# Patient Record
Sex: Female | Born: 1980 | Race: Black or African American | Hispanic: No | Marital: Married | Smoking: Never smoker
Health system: Southern US, Community
[De-identification: ages and names within clinical notes are randomized; demographics above are authoritative.]

## PROBLEM LIST (undated history)

## (undated) ENCOUNTER — Inpatient Hospital Stay (HOSPITAL_COMMUNITY): Payer: Self-pay

## (undated) DIAGNOSIS — F329 Major depressive disorder, single episode, unspecified: Secondary | ICD-10-CM

## (undated) DIAGNOSIS — E785 Hyperlipidemia, unspecified: Secondary | ICD-10-CM

## (undated) DIAGNOSIS — R011 Cardiac murmur, unspecified: Secondary | ICD-10-CM

## (undated) DIAGNOSIS — E119 Type 2 diabetes mellitus without complications: Secondary | ICD-10-CM

## (undated) DIAGNOSIS — F32A Depression, unspecified: Secondary | ICD-10-CM

## (undated) DIAGNOSIS — F419 Anxiety disorder, unspecified: Secondary | ICD-10-CM

## (undated) DIAGNOSIS — D649 Anemia, unspecified: Secondary | ICD-10-CM

## (undated) DIAGNOSIS — Z8639 Personal history of other endocrine, nutritional and metabolic disease: Secondary | ICD-10-CM

## (undated) DIAGNOSIS — I10 Essential (primary) hypertension: Secondary | ICD-10-CM

## (undated) HISTORY — PX: GASTRIC BYPASS: SHX52

## (undated) HISTORY — DX: Essential (primary) hypertension: I10

## (undated) HISTORY — PX: HERNIA REPAIR: SHX51

## (undated) HISTORY — DX: Depression, unspecified: F32.A

## (undated) HISTORY — PX: OTHER SURGICAL HISTORY: SHX169

## (undated) HISTORY — DX: Major depressive disorder, single episode, unspecified: F32.9

## (undated) HISTORY — DX: Type 2 diabetes mellitus without complications: E11.9

## (undated) HISTORY — PX: APPENDECTOMY: SHX54

## (undated) HISTORY — DX: Personal history of other endocrine, nutritional and metabolic disease: Z86.39

## (undated) HISTORY — DX: Cardiac murmur, unspecified: R01.1

## (undated) HISTORY — PX: TUBAL LIGATION: SHX77

## (undated) HISTORY — DX: Hyperlipidemia, unspecified: E78.5

## (undated) HISTORY — DX: Anxiety disorder, unspecified: F41.9

---

## 2012-10-08 ENCOUNTER — Encounter (HOSPITAL_COMMUNITY): Payer: Self-pay | Admitting: Emergency Medicine

## 2012-10-08 ENCOUNTER — Inpatient Hospital Stay (HOSPITAL_COMMUNITY)
Admission: AD | Admit: 2012-10-08 | Discharge: 2012-10-08 | Disposition: A | Discharge status: Home / Self Care | Payer: Self-pay | Attending: Obstetrics & Gynecology | Admitting: Obstetrics & Gynecology

## 2012-10-08 ENCOUNTER — Emergency Department (INDEPENDENT_AMBULATORY_CARE_PROVIDER_SITE_OTHER)
Admission: EM | Admit: 2012-10-08 | Discharge: 2012-10-08 | Disposition: A | Discharge status: Home / Self Care | Payer: Self-pay | Source: Home / Self Care

## 2012-10-08 DIAGNOSIS — K089 Disorder of teeth and supporting structures, unspecified: Secondary | ICD-10-CM

## 2012-10-08 DIAGNOSIS — K0889 Other specified disorders of teeth and supporting structures: Secondary | ICD-10-CM

## 2012-10-08 MED ORDER — PENICILLIN V POTASSIUM 500 MG PO TABS: 500.0000 mg | ORAL_TABLET | Freq: Four times a day (QID) | ORAL | Status: DC

## 2012-10-08 MED ORDER — HYDROCODONE-ACETAMINOPHEN 5-325 MG PO TABS: 1.0000 | ORAL_TABLET | Freq: Four times a day (QID) | ORAL | Status: DC | PRN

## 2012-10-08 NOTE — ED Provider Notes (Signed)
Jaclyn Vaughn is a 32 y.o. female who presents to Urgent Care today for tooth pain present for several days. Patient has pain in her upper right molar. This is been present for 2 days. It is worsened recently. She denies any fevers or chills. She's tried over-the-counter pain medications which are only marginally effective. She's been told in past that she has a cavity which is attention however she did have money to pay for dental work at that time. She feels well otherwise   PMH reviewed. Hypertension and diet-controlled diabetes History  Substance Use Topics  . Smoking status: Never Smoker   . Smokeless tobacco: Not on file  . Alcohol Use: No   ROS as above Medications reviewed. No current facility-administered medications for this encounter.   Current Outpatient Prescriptions  Medication Sig Dispense Refill  . buPROPion (WELLBUTRIN SR) 150 MG 12 hr tablet Take 150 mg by mouth 2 (two) times daily.      . citalopram (CELEXA) 40 MG tablet Take 40 mg by mouth daily.      Marland Kitchen lisinopril (PRINIVIL,ZESTRIL) 10 MG tablet Take 10 mg by mouth daily.      . Norgestimate-Ethinyl Estradiol Triphasic (ORTHO TRI-CYCLEN LO) 0.18/0.215/0.25 MG-25 MCG tab Take 1 tablet by mouth daily.      Marland Kitchen HYDROcodone-acetaminophen (NORCO) 5-325 MG per tablet Take 1 tablet by mouth every 6 (six) hours as needed for pain.  30 tablet  0  . penicillin v potassium (VEETID) 500 MG tablet Take 1 tablet (500 mg total) by mouth 4 (four) times daily.  40 tablet  0    Exam:  BP 118/61  Pulse 91  Temp(Src) 98.3 F (36.8 C) (Oral)  Resp 16  SpO2 100%  LMP 09/04/2012 Gen: Well NAD HEENT: EOMI,  MMM, no dental abscess. Upper right premolar tender to touch. Dental carries presents Lungs: CTABL Nl WOB Heart: RRR no MRG   No results found for this or any previous visit (from the past 24 hour(s)). No results found.  Assessment and Plan: 32 y.o. female with dental pain.  Penicillin 500 mg 4 times a day Norco for  pain Followup with dentist ASAP. 2 names provided.  Discussed warning signs or symptoms. Please see discharge instructions. Patient expresses understanding.      Rodolph Bong, MD 10/08/12 [phone]

## 2012-10-08 NOTE — ED Notes (Signed)
Reports: upper right back tooth pain.   Pt was told a year ago there was some decay but pt has not been able to afford dental care  Pt has been taking tylenol and advil with no relief.   Symptoms present x 2 days

## 2012-10-12 NOTE — ED Provider Notes (Signed)
Medical screening examination/treatment/procedure(s) were performed by resident physician or non-physician practitioner and as supervising physician I was immediately available for consultation/collaboration.   Royce Stegman DOUGLAS MD.   Georgena Weisheit D Anadalay Macdonell, MD 10/12/12 2048 

## 2013-01-28 ENCOUNTER — Emergency Department (HOSPITAL_COMMUNITY)
Admission: EM | Admit: 2013-01-28 | Discharge: 2013-01-28 | Disposition: A | Discharge status: Home / Self Care | Payer: Medicaid Other | Attending: Emergency Medicine | Admitting: Emergency Medicine

## 2013-01-28 ENCOUNTER — Encounter (HOSPITAL_COMMUNITY): Payer: Self-pay | Admitting: *Deleted

## 2013-01-28 DIAGNOSIS — K0889 Other specified disorders of teeth and supporting structures: Secondary | ICD-10-CM

## 2013-01-28 DIAGNOSIS — Z79899 Other long term (current) drug therapy: Secondary | ICD-10-CM | POA: Insufficient documentation

## 2013-01-28 DIAGNOSIS — K089 Disorder of teeth and supporting structures, unspecified: Secondary | ICD-10-CM | POA: Insufficient documentation

## 2013-01-28 DIAGNOSIS — K08409 Partial loss of teeth, unspecified cause, unspecified class: Secondary | ICD-10-CM

## 2013-01-28 DIAGNOSIS — K08109 Complete loss of teeth, unspecified cause, unspecified class: Secondary | ICD-10-CM | POA: Insufficient documentation

## 2013-01-28 MED ORDER — HYDROCODONE-ACETAMINOPHEN 5-325 MG PO TABS: 1.0000 | ORAL_TABLET | ORAL | Status: DC | PRN

## 2013-01-28 NOTE — ED Notes (Signed)
Already being examined by Mayer Camel for dental pain.

## 2013-01-28 NOTE — ED Notes (Signed)
Tuesday had wisdom tooth on upper top left side extracted. Pt now c/o some bleeding and pain and was told to go to an urgent care or emergency room for pain control until Monday.

## 2013-01-28 NOTE — ED Provider Notes (Signed)
History    CSN: [id number] Arrival date & time 01/28/13  1955  First MD Initiated Contact with Patient 01/28/13 2023     Chief Complaint  Patient presents with  . Dental Pain   (Consider location/radiation/quality/duration/timing/severity/associated sxs/prior Treatment) Patient is a 32 y.o. female presenting with tooth pain. The history is provided by the patient.  Dental Pain Location:  Upper Upper teeth location:  16/LU 3rd molar Quality:  Shooting, throbbing and constant Severity:  Severe Associated symptoms: no fever, no headaches and no neck pain  Facial swelling: minimal.    KELLYJO EDGREN is a 32 y.o. female who presents to the ED with pain in the area where the third upper left molar was extracted 4 days ago. She was started on Amoxicillin 500 mg and given Percocet for pain at the time of the extraction. She only had 10 percocet. Today she called the office to tell them she still had pain and they were closed and the answering service told the patient to come to the ED for pain management until the office opened on Monday.   History reviewed. No pertinent past medical history. Past Surgical History  Procedure Laterality Date  . Gastric bypass     History reviewed. No pertinent family history. History  Substance Use Topics  . Smoking status: Never Smoker   . Smokeless tobacco: Not on file  . Alcohol Use: No   OB History   Grav Para Term Preterm Abortions TAB SAB Ect Mult Living                 Review of Systems  Constitutional: Negative for fever, chills and activity change.  HENT: Positive for dental problem. Negative for neck pain. Facial swelling: minimal.   Respiratory: Negative for shortness of breath.   Gastrointestinal: Negative for nausea and vomiting.  Neurological: Negative for syncope and headaches.  Psychiatric/Behavioral: The patient is not nervous/anxious.     Allergies  Review of patient's allergies indicates no known allergies.  Home  Medications   Current Outpatient Rx  Name  Route  Sig  Dispense  Refill  . buPROPion (WELLBUTRIN SR) 150 MG 12 hr tablet   Oral   Take 150 mg by mouth 2 (two) times daily.         . citalopram (CELEXA) 40 MG tablet   Oral   Take 40 mg by mouth daily.         Marland Kitchen HYDROcodone-acetaminophen (NORCO) 5-325 MG per tablet   Oral   Take 1 tablet by mouth every 6 (six) hours as needed for pain.   30 tablet   0   . lisinopril (PRINIVIL,ZESTRIL) 10 MG tablet   Oral   Take 10 mg by mouth daily.         . Norgestimate-Ethinyl Estradiol Triphasic (ORTHO TRI-CYCLEN LO) 0.18/0.215/0.25 MG-25 MCG tab   Oral   Take 1 tablet by mouth daily.         . penicillin v potassium (VEETID) 500 MG tablet   Oral   Take 1 tablet (500 mg total) by mouth 4 (four) times daily.   40 tablet   0    BP 153/98  Pulse 88  Temp(Src) 98.3 F (36.8 C) (Oral)  Resp 14  Ht 5\' 3"  (1.6 m)  Wt 189 lb (85.73 kg)  BMI 33.49 kg/m2  SpO2 100%  LMP 01/09/2013 Physical Exam  Nursing note and vitals reviewed. Constitutional: She is oriented to person, place, and time. She appears well-developed  and well-nourished. No distress.  HENT:  Head: Normocephalic.  Mouth/Throat: Uvula is midline, oropharynx is clear and moist and mucous membranes are normal.    Pain in area where third molar upper left extracted.  Eyes: EOM are normal.  Neck: Neck supple.  Pulmonary/Chest: Effort normal.  Abdominal: Soft. There is no tenderness.  Musculoskeletal: Normal range of motion.  Lymphadenopathy:    She has no cervical adenopathy.  Neurological: She is alert and oriented to person, place, and time. No cranial nerve deficit.  Skin: Skin is warm and dry.  Psychiatric: She has a normal mood and affect. Her behavior is normal.    ED Course  Procedures  MDM  32 y.o. female with pain due to wisdom tooth extraction upper left.  Pain management and follow up with dentist on Monday.  Discussed with the patient plan of  care and all questioned fully answered.    Medication List    STOP taking these medications       penicillin v potassium 500 MG tablet  Commonly known as:  VEETID      TAKE these medications       HYDROcodone-acetaminophen 5-325 MG per tablet  Commonly known as:  NORCO/VICODIN  Take 1 tablet by mouth every 4 (four) hours as needed.      ASK your doctor about these medications       amoxicillin 500 MG capsule  Commonly known as:  AMOXIL  Take 500 mg by mouth 3 (three) times daily.     buPROPion 150 MG 12 hr tablet  Commonly known as:  WELLBUTRIN SR  Take 150 mg by mouth 2 (two) times daily.     citalopram 40 MG tablet  Commonly known as:  CELEXA  Take 40 mg by mouth daily.     lisinopril 10 MG tablet  Commonly known as:  PRINIVIL,ZESTRIL  Take 10 mg by mouth daily.     ORTHO TRI-CYCLEN LO 0.18/0.215/0.25 MG-25 MCG tab  Generic drug:  Norgestimate-Ethinyl Estradiol Triphasic  Take 1 tablet by mouth daily.         [facility] Orlene Och, [place] 01/29/13 [phone]

## 2013-01-29 NOTE — ED Provider Notes (Signed)
Medical screening examination/treatment/procedure(s) were performed by non-physician practitioner and as supervising physician I was immediately available for consultation/collaboration.  Flint Melter, MD 01/29/13 0157

## 2014-01-27 ENCOUNTER — Encounter: Payer: Self-pay | Admitting: *Deleted

## 2014-02-09 ENCOUNTER — Encounter: Payer: Self-pay | Admitting: Advanced Practice Midwife

## 2015-02-05 ENCOUNTER — Encounter: Payer: Self-pay | Admitting: Adult Health

## 2015-02-15 ENCOUNTER — Encounter: Payer: Medicaid Other | Attending: Nurse Practitioner | Admitting: *Deleted

## 2015-02-15 ENCOUNTER — Ambulatory Visit (HOSPITAL_COMMUNITY)
Admission: RE | Admit: 2015-02-15 | Discharge: 2015-02-15 | Disposition: A | Discharge status: Home / Self Care | Payer: Medicaid Other | Source: Ambulatory Visit | Attending: Unknown Physician Specialty | Admitting: Unknown Physician Specialty

## 2015-02-15 DIAGNOSIS — O24111 Pre-existing diabetes mellitus, type 2, in pregnancy, first trimester: Secondary | ICD-10-CM | POA: Insufficient documentation

## 2015-02-15 DIAGNOSIS — Z713 Dietary counseling and surveillance: Secondary | ICD-10-CM | POA: Insufficient documentation

## 2015-02-15 NOTE — Progress Notes (Signed)
  Patient was seen on 02/15/15 for Diabetes (complicated by pregnancy ) self-management . Prior to pregnancy patient taking Janumet 50/1000. She was advised to continue and added Levimir 25units HS on 02/15/15. Inconsult with Dr. Burnett Harry we will continue this regimen at this time.  The following learning objectives were met by the patient :   States why dietary management is important in controlling blood glucose  Describes the effects of carbohydrates on blood glucose levels  Demonstrates ability to create a balanced meal plan  Demonstrates carbohydrate counting   States when to check blood glucose levels  Demonstrates proper blood glucose monitoring techniques  States the effect of stress and exercise on blood glucose levels  Plan:  Aim for 2 Carb Choices per meal (30 grams) +/- 1 either way for breakfast Aim for 3 Carb Choices per meal (45 grams) +/- 1 either way from lunch and dinner Aim for 1-2 Carbs per snack Begin reading food labels for Total Carbohydrate and sugar grams of foods Consider  increasing your activity level by walking daily as tolerated Begin checking BG before breakfast and 2 hours after first bit of breakfast, lunch and dinner after  as directed by MD  Take medication  as directed by MD  Blood glucose monitor given: None - patient presently testing BID  Patient instructed to monitor glucose levels: FBS: 60 - <90 2 hour: <120  Patient received the following handouts:  Nutrition Diabetes and Pregnancy  Carbohydrate Counting List  Meal Planning worksheet  Patient will be seen for follow-up as needed.

## 2015-02-23 ENCOUNTER — Encounter (HOSPITAL_COMMUNITY): Payer: Self-pay

## 2015-02-23 ENCOUNTER — Ambulatory Visit (HOSPITAL_COMMUNITY)
Admission: RE | Admit: 2015-02-23 | Discharge: 2015-02-23 | Disposition: A | Discharge status: Home / Self Care | Payer: Medicaid Other | Source: Ambulatory Visit | Attending: Unknown Physician Specialty | Admitting: Unknown Physician Specialty

## 2015-02-23 VITALS — BP 133/96 | HR 80 | Wt 248.6 lb

## 2015-02-23 DIAGNOSIS — E1165 Type 2 diabetes mellitus with hyperglycemia: Secondary | ICD-10-CM | POA: Insufficient documentation

## 2015-02-23 DIAGNOSIS — O99842 Bariatric surgery status complicating pregnancy, second trimester: Secondary | ICD-10-CM | POA: Diagnosis not present

## 2015-02-23 DIAGNOSIS — O3421 Maternal care for scar from previous cesarean delivery: Secondary | ICD-10-CM | POA: Diagnosis not present

## 2015-02-23 DIAGNOSIS — O10912 Unspecified pre-existing hypertension complicating pregnancy, second trimester: Secondary | ICD-10-CM | POA: Diagnosis not present

## 2015-02-23 DIAGNOSIS — O99342 Other mental disorders complicating pregnancy, second trimester: Secondary | ICD-10-CM | POA: Diagnosis not present

## 2015-02-23 DIAGNOSIS — O24912 Unspecified diabetes mellitus in pregnancy, second trimester: Secondary | ICD-10-CM

## 2015-02-23 DIAGNOSIS — O99012 Anemia complicating pregnancy, second trimester: Secondary | ICD-10-CM | POA: Diagnosis not present

## 2015-02-23 DIAGNOSIS — Z3A14 14 weeks gestation of pregnancy: Secondary | ICD-10-CM | POA: Insufficient documentation

## 2015-02-23 DIAGNOSIS — D509 Iron deficiency anemia, unspecified: Secondary | ICD-10-CM | POA: Insufficient documentation

## 2015-02-23 DIAGNOSIS — F329 Major depressive disorder, single episode, unspecified: Secondary | ICD-10-CM | POA: Diagnosis not present

## 2015-02-23 DIAGNOSIS — O24112 Pre-existing diabetes mellitus, type 2, in pregnancy, second trimester: Secondary | ICD-10-CM | POA: Insufficient documentation

## 2015-02-23 LAB — COMPREHENSIVE METABOLIC PANEL
ALT: 12 U/L — AB (ref 14–54)
AST: 18 U/L (ref 15–41)
Albumin: 3.4 g/dL — ABNORMAL LOW (ref 3.5–5.0)
Alkaline Phosphatase: 39 U/L (ref 38–126)
Anion gap: 10 (ref 5–15)
BUN: 6 mg/dL (ref 6–20)
CALCIUM: 9.3 mg/dL (ref 8.9–10.3)
CO2: 24 mmol/L (ref 22–32)
CREATININE: 0.46 mg/dL (ref 0.44–1.00)
Chloride: 100 mmol/L — ABNORMAL LOW (ref 101–111)
GLUCOSE: 72 mg/dL (ref 65–99)
Potassium: 4.6 mmol/L (ref 3.5–5.1)
SODIUM: 134 mmol/L — AB (ref 135–145)
Total Bilirubin: 0.4 mg/dL (ref 0.3–1.2)
Total Protein: 6.8 g/dL (ref 6.5–8.1)

## 2015-02-23 LAB — FERRITIN: FERRITIN: 6 ng/mL — AB (ref 11–307)

## 2015-02-23 LAB — VITAMIN B12: VITAMIN B 12: 71 pg/mL — AB (ref 180–914)

## 2015-02-23 NOTE — Consult Note (Signed)
Maternal Fetal Medicine Consultation  Requesting Provider(s): Jaclyn Dick, MD  Reason for consultation: Type 2 diabetes, hx of gastric bypass  HPI: Jaclyn Vaughn is a 34 yo G3P1102, EDD 08/19/2015 who is currently at 14w 5d was seen for consultation due to poorly controlled pre-existing type 2 diabetes and a history of gastric bypass. She was diagnosed with diabetes in 2001, and to her knowledge, does not have any known end-organ disease.  Her last evaluation of her eye grounds was in Sept 2015. She underwent gastric bypass sin 2009 and subsequently lost 153 lbs (later gained 60 lbs back).  She previously had chronic hypertension, but has not required medication to control her blood pressures.  Her HbA1C was 10.7% in May 2016, but her most recent HbA1C was 6.8 on July 28th.  She is currently on Metformin/ Jenuvia (Janumet 50/1000) and Levimir 25 units HS.  Review of her fingerstick values shows some rare elevated fasting values and some rare post prandial values > 130- 85-90% of her CPG values are within target range.  Jaclyn Vaughn reports a history of preeclampsia with her first pregnancy and delivered via C-section at 35 weeks.  Her second pregnancy was an elective repeat C-section at 39 weeks without complications.  She is without complaints today.  OB History: OB History    Gravida Para Term Preterm AB TAB SAB Ectopic Multiple Living   0 0 0 0 0 2      PMH:  Past Medical History  Diagnosis Date  . History of vitamin D deficiency   . Diabetes mellitus without complication   . Anxiety   . Depression   . Heart murmur   . Myocardial infarction   . Hypertension   . Hyperlipidemia     PSH:  Past Surgical History  Procedure Laterality Date  . Gastric bypass    . Cesarean section  02/2005  . Lap wound exploration     Meds:  Celexa 20 mg daily Ferrous sulfate 325 mg daily Vitamin D 5000 units 2x weekly Folic acid 1 mg daily Levimir 25 units HS Janumet 50/1000  daily Prenatal vitamins  Allergies: No Known Allergies   FH:  Family History  Problem Relation Age of Onset  . Diabetes Mother   . Hypertension Mother   . Healthy Daughter   . Healthy Son    Soc:  Social History   Social History  . Marital Status: Single    Spouse Name: N/A  . Number of Children: N/A  . Years of Education: N/A   Occupational History  . Not on file.   Social History Main Topics  . Smoking status: Never Smoker   . Smokeless tobacco: Not on file  . Alcohol Use: No  . Drug Use: No  . Sexual Activity: Yes    Birth Control/ Protection: Condom   Other Topics Concern  . Not on file   Social History Narrative    Review of Systems: no vaginal bleeding or cramping/contractions, no LOF, no nausea/vomiting. All other systems reviewed and are negative.  PE:  VS: BP   133/96                 pulse   80                Weight  248.6 lbs   A/P: 1) Single IUP at 14w 5d  2) Type 2 diabetes - initial HbA1C > 10; improved, now at 6.8%.  Overall reasonable control  on current medications.  3) History of Gastric bypass  4) Iron deficiency anemia  5) Depression on Celexa  6) Chronic hypertension not on medications  7) Hx of Cesarean delivery x 2  Recommendations: 1) While Januvia (Sitagliptin) is not thought to be a teratogen, there is very limited experience with it's use in pregnancy.  Because of this, I would recommend that this be discontinued.  We will continue Metformin 500 mg BID and Levimir 25 units HS.  As the pregnancy progresses, she will likely need a higher dose and will very likely need meal time Humolog as well.  Jaclyn Vaughn will call her fingerstick log results to [place] in 2 weeks for review and will also follow up with her primary Obstetricians. 2) Baseline CMP, B12 and Ferritin levels were drawn.  Would recommend that a baseline 24-hr urine protein / creatinine clearance be ordered at the next prenatal visit. 3) Detailed ultrasound at 18 weeks (ordered).   The patient will need serial growth studies every 4 weeks thereafter. 5) Fetal echo at 20-22 weeks (ordered) 7) The patient has a baseline microcytic anemia with a Hb of 10.4. Due to the patient's prior R-Y gastric bypass, she may have some difficulty absorbing iron and may be a candidate for IV iron supplementation.  If she becomes increasingly anemic despite iron supplements, this may need to be explored further. 8) Antenatal testing beginning no later than [redacted] weeks gestation 19) Recommend delivery by [redacted] weeks gestation in the absence of other complications.   Thank you for the opportunity to be a part of the care of Jaclyn Vaughn. Please contact our office if we can be of further assistance.   I spent approximately 30 minutes with this patient with over 50% of time spent in face-to-face counseling.  Alpha Gula, MD Maternal Fetal Medicine

## 2015-02-23 NOTE — ED Notes (Signed)
Prescription for Metformin  BID called into [facility].

## 2015-02-26 ENCOUNTER — Other Ambulatory Visit (HOSPITAL_COMMUNITY): Payer: Self-pay | Admitting: Unknown Physician Specialty

## 2015-02-26 ENCOUNTER — Telehealth (HOSPITAL_COMMUNITY): Payer: Self-pay | Admitting: *Deleted

## 2015-02-26 NOTE — Telephone Encounter (Signed)
Called Jaclyn Vaughn with her lab results from last week.  Notified of her ferretin level being low and B-12 being low.  These results have been faxed to Dr. Madalyn Rob office and I called and spoke with his nurse Misty Stanley about these lab results.  Instructed patient to call office if she has not heard from them after a couple of days.  Pt verbalized understanding.

## 2015-03-27 ENCOUNTER — Other Ambulatory Visit (HOSPITAL_COMMUNITY): Payer: Self-pay | Admitting: *Deleted

## 2015-03-27 ENCOUNTER — Other Ambulatory Visit (HOSPITAL_COMMUNITY): Payer: Self-pay | Admitting: Unknown Physician Specialty

## 2015-03-27 ENCOUNTER — Other Ambulatory Visit (HOSPITAL_COMMUNITY): Payer: Self-pay | Admitting: Obstetrics and Gynecology

## 2015-03-27 ENCOUNTER — Ambulatory Visit (HOSPITAL_COMMUNITY)
Admission: RE | Admit: 2015-03-27 | Discharge: 2015-03-27 | Disposition: A | Discharge status: Home / Self Care | Payer: Medicaid Other | Source: Ambulatory Visit | Attending: Unknown Physician Specialty | Admitting: Unknown Physician Specialty

## 2015-03-27 ENCOUNTER — Encounter (HOSPITAL_COMMUNITY): Payer: Self-pay

## 2015-03-27 ENCOUNTER — Other Ambulatory Visit (HOSPITAL_COMMUNITY): Payer: Self-pay | Admitting: Maternal and Fetal Medicine

## 2015-03-27 DIAGNOSIS — Z3A19 19 weeks gestation of pregnancy: Secondary | ICD-10-CM | POA: Insufficient documentation

## 2015-03-27 DIAGNOSIS — O24912 Unspecified diabetes mellitus in pregnancy, second trimester: Secondary | ICD-10-CM | POA: Insufficient documentation

## 2015-03-27 DIAGNOSIS — O09212 Supervision of pregnancy with history of pre-term labor, second trimester: Secondary | ICD-10-CM

## 2015-03-27 DIAGNOSIS — O10019 Pre-existing essential hypertension complicating pregnancy, unspecified trimester: Secondary | ICD-10-CM

## 2015-03-27 DIAGNOSIS — O09892 Supervision of other high risk pregnancies, second trimester: Secondary | ICD-10-CM

## 2015-03-27 DIAGNOSIS — O09522 Supervision of elderly multigravida, second trimester: Secondary | ICD-10-CM | POA: Insufficient documentation

## 2015-03-27 DIAGNOSIS — O24112 Pre-existing diabetes mellitus, type 2, in pregnancy, second trimester: Secondary | ICD-10-CM

## 2015-03-27 DIAGNOSIS — I10 Essential (primary) hypertension: Secondary | ICD-10-CM | POA: Insufficient documentation

## 2015-03-27 DIAGNOSIS — Z3689 Encounter for other specified antenatal screening: Secondary | ICD-10-CM

## 2015-03-27 DIAGNOSIS — E119 Type 2 diabetes mellitus without complications: Secondary | ICD-10-CM

## 2015-03-27 DIAGNOSIS — O24919 Unspecified diabetes mellitus in pregnancy, unspecified trimester: Principal | ICD-10-CM

## 2015-03-27 MED ORDER — METFORMIN HCL 500 MG PO TABS: 500.0000 mg | ORAL_TABLET | Freq: Two times a day (BID) | ORAL | Status: AC

## 2015-04-24 ENCOUNTER — Ambulatory Visit (HOSPITAL_COMMUNITY)
Admission: RE | Admit: 2015-04-24 | Discharge: 2015-04-24 | Disposition: A | Discharge status: Home / Self Care | Payer: Medicaid Other | Source: Ambulatory Visit | Attending: Unknown Physician Specialty | Admitting: Unknown Physician Specialty

## 2015-04-24 VITALS — BP 129/80 | HR 78 | Wt 264.4 lb

## 2015-04-24 DIAGNOSIS — O24919 Unspecified diabetes mellitus in pregnancy, unspecified trimester: Secondary | ICD-10-CM | POA: Diagnosis not present

## 2015-04-24 DIAGNOSIS — E119 Type 2 diabetes mellitus without complications: Secondary | ICD-10-CM | POA: Diagnosis not present

## 2015-04-24 DIAGNOSIS — O09522 Supervision of elderly multigravida, second trimester: Secondary | ICD-10-CM

## 2015-05-22 ENCOUNTER — Encounter (HOSPITAL_COMMUNITY): Payer: Self-pay

## 2015-05-22 ENCOUNTER — Ambulatory Visit (HOSPITAL_COMMUNITY)
Admission: RE | Admit: 2015-05-22 | Discharge: 2015-05-22 | Disposition: A | Discharge status: Home / Self Care | Payer: Medicaid Other | Source: Ambulatory Visit | Attending: Unknown Physician Specialty | Admitting: Unknown Physician Specialty

## 2015-05-22 VITALS — BP 128/64 | HR 82 | Wt 265.2 lb

## 2015-05-22 DIAGNOSIS — O10019 Pre-existing essential hypertension complicating pregnancy, unspecified trimester: Secondary | ICD-10-CM | POA: Insufficient documentation

## 2015-05-22 DIAGNOSIS — O09522 Supervision of elderly multigravida, second trimester: Secondary | ICD-10-CM | POA: Insufficient documentation

## 2015-05-22 DIAGNOSIS — O99212 Obesity complicating pregnancy, second trimester: Secondary | ICD-10-CM | POA: Diagnosis not present

## 2015-05-22 DIAGNOSIS — O09523 Supervision of elderly multigravida, third trimester: Secondary | ICD-10-CM

## 2015-05-22 DIAGNOSIS — Z36 Encounter for antenatal screening of mother: Secondary | ICD-10-CM | POA: Insufficient documentation

## 2015-05-22 DIAGNOSIS — O24112 Pre-existing diabetes mellitus, type 2, in pregnancy, second trimester: Secondary | ICD-10-CM | POA: Insufficient documentation

## 2015-05-22 DIAGNOSIS — E119 Type 2 diabetes mellitus without complications: Secondary | ICD-10-CM

## 2015-05-22 DIAGNOSIS — Z3A27 27 weeks gestation of pregnancy: Secondary | ICD-10-CM | POA: Insufficient documentation

## 2015-05-22 DIAGNOSIS — O24919 Unspecified diabetes mellitus in pregnancy, unspecified trimester: Secondary | ICD-10-CM

## 2015-06-19 ENCOUNTER — Ambulatory Visit (HOSPITAL_COMMUNITY)
Admission: RE | Admit: 2015-06-19 | Discharge: 2015-06-19 | Disposition: A | Discharge status: Home / Self Care | Payer: Medicaid Other | Source: Ambulatory Visit | Attending: Nurse Practitioner | Admitting: Nurse Practitioner

## 2015-06-19 ENCOUNTER — Other Ambulatory Visit (HOSPITAL_COMMUNITY): Payer: Self-pay | Admitting: Maternal and Fetal Medicine

## 2015-06-19 DIAGNOSIS — O34219 Maternal care for unspecified type scar from previous cesarean delivery: Secondary | ICD-10-CM | POA: Diagnosis not present

## 2015-06-19 DIAGNOSIS — O24113 Pre-existing diabetes mellitus, type 2, in pregnancy, third trimester: Secondary | ICD-10-CM

## 2015-06-19 DIAGNOSIS — O09293 Supervision of pregnancy with other poor reproductive or obstetric history, third trimester: Secondary | ICD-10-CM | POA: Diagnosis not present

## 2015-06-19 DIAGNOSIS — O99212 Obesity complicating pregnancy, second trimester: Secondary | ICD-10-CM

## 2015-06-19 DIAGNOSIS — O99213 Obesity complicating pregnancy, third trimester: Secondary | ICD-10-CM | POA: Diagnosis not present

## 2015-06-19 DIAGNOSIS — O99843 Bariatric surgery status complicating pregnancy, third trimester: Secondary | ICD-10-CM | POA: Diagnosis not present

## 2015-06-19 DIAGNOSIS — Z3A31 31 weeks gestation of pregnancy: Secondary | ICD-10-CM

## 2015-06-19 DIAGNOSIS — O10019 Pre-existing essential hypertension complicating pregnancy, unspecified trimester: Secondary | ICD-10-CM

## 2015-06-19 DIAGNOSIS — O09523 Supervision of elderly multigravida, third trimester: Secondary | ICD-10-CM | POA: Diagnosis not present

## 2015-06-19 DIAGNOSIS — O09299 Supervision of pregnancy with other poor reproductive or obstetric history, unspecified trimester: Secondary | ICD-10-CM

## 2015-06-19 DIAGNOSIS — O1013 Pre-existing hypertensive heart disease complicating the puerperium: Secondary | ICD-10-CM | POA: Diagnosis not present

## 2015-06-19 DIAGNOSIS — O24919 Unspecified diabetes mellitus in pregnancy, unspecified trimester: Secondary | ICD-10-CM

## 2015-06-19 DIAGNOSIS — E119 Type 2 diabetes mellitus without complications: Secondary | ICD-10-CM

## 2015-07-17 ENCOUNTER — Encounter (HOSPITAL_COMMUNITY): Payer: Self-pay | Admitting: *Deleted

## 2015-07-17 ENCOUNTER — Ambulatory Visit (HOSPITAL_COMMUNITY)
Admission: RE | Admit: 2015-07-17 | Discharge: 2015-07-17 | Disposition: A | Discharge status: Home / Self Care | Payer: Medicaid Other | Source: Ambulatory Visit | Attending: Nurse Practitioner | Admitting: Nurse Practitioner

## 2015-07-17 ENCOUNTER — Inpatient Hospital Stay (HOSPITAL_COMMUNITY)
Admission: AD | Admit: 2015-07-17 | Discharge: 2015-07-17 | Disposition: A | Discharge status: Home / Self Care | Payer: Medicaid Other | Source: Ambulatory Visit | Attending: Obstetrics & Gynecology | Admitting: Obstetrics & Gynecology

## 2015-07-17 ENCOUNTER — Other Ambulatory Visit (HOSPITAL_COMMUNITY): Payer: Self-pay | Admitting: Maternal and Fetal Medicine

## 2015-07-17 DIAGNOSIS — O99843 Bariatric surgery status complicating pregnancy, third trimester: Secondary | ICD-10-CM

## 2015-07-17 DIAGNOSIS — O10013 Pre-existing essential hypertension complicating pregnancy, third trimester: Secondary | ICD-10-CM | POA: Insufficient documentation

## 2015-07-17 DIAGNOSIS — O09523 Supervision of elderly multigravida, third trimester: Secondary | ICD-10-CM | POA: Insufficient documentation

## 2015-07-17 DIAGNOSIS — O24113 Pre-existing diabetes mellitus, type 2, in pregnancy, third trimester: Secondary | ICD-10-CM | POA: Insufficient documentation

## 2015-07-17 DIAGNOSIS — Z3A35 35 weeks gestation of pregnancy: Secondary | ICD-10-CM | POA: Diagnosis not present

## 2015-07-17 DIAGNOSIS — O34219 Maternal care for unspecified type scar from previous cesarean delivery: Secondary | ICD-10-CM | POA: Diagnosis not present

## 2015-07-17 DIAGNOSIS — O4703 False labor before 37 completed weeks of gestation, third trimester: Secondary | ICD-10-CM | POA: Diagnosis not present

## 2015-07-17 DIAGNOSIS — O10019 Pre-existing essential hypertension complicating pregnancy, unspecified trimester: Secondary | ICD-10-CM

## 2015-07-17 DIAGNOSIS — E119 Type 2 diabetes mellitus without complications: Secondary | ICD-10-CM

## 2015-07-17 DIAGNOSIS — O99213 Obesity complicating pregnancy, third trimester: Secondary | ICD-10-CM

## 2015-07-17 DIAGNOSIS — O24919 Unspecified diabetes mellitus in pregnancy, unspecified trimester: Secondary | ICD-10-CM

## 2015-07-17 HISTORY — DX: Anemia, unspecified: D64.9

## 2015-07-17 LAB — URINALYSIS, ROUTINE W REFLEX MICROSCOPIC
Bilirubin Urine: NEGATIVE
Glucose, UA: NEGATIVE mg/dL
Ketones, ur: >80 mg/dL — AB
Nitrite: NEGATIVE
Protein, ur: NEGATIVE mg/dL
Specific Gravity, Urine: 1.025 (ref 1.005–1.030)
pH: 6 (ref 5.0–8.0)

## 2015-07-17 LAB — URINE MICROSCOPIC-ADD ON

## 2015-07-17 MED ORDER — TERBUTALINE SULFATE 1 MG/ML IJ SOLN
0.2500 mg | Freq: Once | INTRAMUSCULAR | Status: AC
  Administered 2015-07-17: 0.25 mg via SUBCUTANEOUS
  Filled 2015-07-17: qty 1

## 2015-07-17 MED ORDER — NIFEDIPINE 10 MG PO CAPS
10.0000 mg | ORAL_CAPSULE | Freq: Once | ORAL | Status: AC
Start: 2015-07-17 — End: 2015-07-17
  Administered 2015-07-17: 10 mg via ORAL
  Filled 2015-07-17: qty 1

## 2015-07-17 MED ORDER — NIFEDIPINE 10 MG PO CAPS
10.0000 mg | ORAL_CAPSULE | Freq: Once | ORAL | Status: AC
  Administered 2015-07-17: 10 mg via ORAL
  Filled 2015-07-17: qty 1

## 2015-07-17 MED ORDER — LACTATED RINGERS IV SOLN
INTRAVENOUS | Status: DC
Start: 2015-07-17 — End: 2015-07-18

## 2015-07-17 MED ORDER — LACTATED RINGERS IV BOLUS (SEPSIS)
1000.0000 mL | Freq: Once | INTRAVENOUS | Status: AC
  Administered 2015-07-17: 1000 mL via INTRAVENOUS

## 2015-07-17 NOTE — MAU Provider Note (Signed)
History     CSN: [id number]  Arrival date and time: 07/17/15 1630   First Provider Initiated Contact with Patient 07/17/15 1707      Chief Complaint  Patient presents with  . Labor Eval   HPI  Jaclyn Vaughn 35 y.o. [id number] @ 1937w2d presents from MFM for preterm labor evaluation. [place] in [place] and plans a [facility]. Her pregnancy is complicated by gastric bypass, hypertension, Previous C/S x 2. Denies vaginal bleeding, LOF. Pt states she has been contracting all day today.   Past Medical History  Diagnosis Date  . History of vitamin D deficiency   . Diabetes mellitus without complication (HCC)   . Anxiety   . Depression   . Heart murmur   . Hypertension   . Hyperlipidemia   . Anemia     Past Surgical History  Procedure Laterality Date  . Gastric bypass    . Cesarean section  02/2005  . Lap wound exploration    . Hernia repair      Family History  Problem Relation Age of Onset  . Diabetes Mother   . Hypertension Mother   . Healthy Daughter   . Healthy Son     Social History  Substance Use Topics  . Smoking status: Never Smoker   . Smokeless tobacco: None  . Alcohol Use: No    Allergies: No Known Allergies  No prescriptions prior to admission    Review of Systems  Constitutional: Negative for fever.  Gastrointestinal: Positive for abdominal pain.  All other systems reviewed and are negative.  Physical Exam   Blood pressure 152/78, pulse 108, resp. rate 18, last menstrual period 11/27/2014.  Physical Exam  Nursing note and vitals reviewed. Constitutional: She is oriented to person, place, and time. She appears well-developed and well-nourished. No distress.  HENT:  Head: Normocephalic and atraumatic.  Cardiovascular: Normal rate and regular rhythm.   Respiratory: Effort normal and breath sounds normal. No respiratory distress.  GI: Soft. Bowel sounds are normal. She exhibits no mass. There is no  tenderness. There is no rebound and no guarding.  Genitourinary: Vagina normal.  Musculoskeletal: Normal range of motion.  Neurological: She is alert and oriented to person, place, and time.  Skin: Skin is warm and dry.  Psychiatric: She has a normal mood and affect. Her behavior is normal. Judgment and thought content normal.   Dilation: Fingertip Effacement (%): Thick Exam by:: Wenzel,PA  Results for orders placed or performed during the hospital encounter of 07/17/15 (from the past 24 hour(s))  Urinalysis, Routine w reflex microscopic (not at [facility])     Status: Abnormal   Collection Time: 07/17/15  4:35 PM  Result Value Ref Range   Color, Urine YELLOW YELLOW   APPearance HAZY (A) CLEAR   Specific Gravity, Urine 1.025 1.005 - 1.030   pH 6.0 5.0 - 8.0   Glucose, UA NEGATIVE NEGATIVE mg/dL   Hgb urine dipstick LARGE (A) NEGATIVE   Bilirubin Urine NEGATIVE NEGATIVE   Ketones, ur >80 (A) NEGATIVE mg/dL   Protein, ur NEGATIVE NEGATIVE mg/dL   Nitrite NEGATIVE NEGATIVE   Leukocytes, UA SMALL (A) NEGATIVE  Urine microscopic-add on     Status: Abnormal   Collection Time: 07/17/15  4:35 PM  Result Value Ref Range   Squamous Epithelial / LPF 0-5 (A) NONE SEEN   WBC, UA 0-5 0 - 5 WBC/hpf   RBC / HPF 6-30 0 - 5  RBC/hpf   Bacteria, UA FEW (A) NONE SEEN    MAU Course  Procedures  MDM EFm- Contractions noted q 1-2 minutes.;mild. Pt has had 2 doses of procardia and a bag of LR. Pt is sleeping now. Not feeling contractions. Pt will be discharged home tonight. Care signed off to [facility].  Clemmons,Lori Grissett 07/17/2015, 5:22 PM   2000 - Care assumed from [facility], CNM. Patient began contracting q 1 minute around 1956. Third and final dose of Procardia given. Re-check of cervix is unchanged from previous. Blood noted on glove.  Discussed patient with Dr. Debroah Loop. He is present  in MAU to evaluate the patient. He recommends 0.25 mg SQ terbutaline given patient not responding  adequately to Procardia.  0.25 mg SQ terbutaline given and contractions have spaced out and are less painful per patient. Also noted to be shorter in duration and q 4-5 minutes on TOCO Discussed patient update with Dr. Debroah Loop. Recommends discharge with PTL precautions if cervix is unchanged.  Recheck of cervix is unchanged. Patient stable for discharge Assessment and Plan  A: SIUP at [redacted]w[redacted]d Preterm Contractions  P: Discharge home Preterm labor precautions discussed Patient advised to follow-up with OB provider in [place] this week Patient may return to MAU as needed or if her condition were to change or worsen  Marny Lowenstein, PA-C 07/18/2015 12:35 AM

## 2015-07-17 NOTE — Discharge Instructions (Signed)
Pelvic Rest °Pelvic rest is sometimes recommended for women when:  °· The placenta is partially or completely covering the opening of the cervix (placenta previa). °· There is bleeding between the uterine wall and the amniotic sac in the first trimester (subchorionic hemorrhage). °· The cervix begins to open without labor starting (incompetent cervix, cervical insufficiency). °· The labor is too early (preterm labor). °HOME CARE INSTRUCTIONS °· Do not have sexual intercourse, stimulation, or an orgasm. °· Do not use tampons, douche, or put anything in the vagina. °· Do not lift anything over 10 pounds (4.5 kg). °· Avoid strenuous activity or straining your pelvic muscles. °SEEK MEDICAL CARE IF:  °· You have any vaginal bleeding during pregnancy. Treat this as a potential emergency. °· You have cramping pain felt low in the stomach (stronger than menstrual cramps). °· You notice vaginal discharge (watery, mucus, or bloody). °· You have a low, dull backache. °· There are regular contractions or uterine tightening. °SEEK IMMEDIATE MEDICAL CARE IF: °You have vaginal bleeding and have placenta previa.  °  °This information is not intended to replace advice given to you by your health care provider. Make sure you discuss any questions you have with your health care provider. °  °Document Released: 10/25/2010 Document Revised: 09/22/2011 Document Reviewed: 01/01/2015 °Elsevier Interactive Patient Education ©2016 [employer] ° °Braxton Hicks Contractions °Contractions of the uterus can occur throughout pregnancy. Contractions are not always a sign that you are in labor.  °WHAT ARE BRAXTON HICKS CONTRACTIONS?  °Contractions that occur before labor are called Braxton Hicks contractions, or false labor. Toward the end of pregnancy (32-34 weeks), these contractions can develop more often and may become more forceful. This is not true labor because these contractions do not result in opening (dilatation) and thinning of the  cervix. They are sometimes difficult to tell apart from true labor because these contractions can be forceful and people have different pain tolerances. You should not feel embarrassed if you go to the hospital with false labor. Sometimes, the only way to tell if you are in true labor is for your health care provider to look for changes in the cervix. °If there are no prenatal problems or other health problems associated with the pregnancy, it is completely safe to be sent home with false labor and await the onset of true labor. °HOW CAN YOU TELL THE DIFFERENCE BETWEEN TRUE AND FALSE LABOR? °False Labor °· The contractions of false labor are usually shorter and not as hard as those of true labor.   °· The contractions are usually irregular.   °· The contractions are often felt in the front of the lower abdomen and in the groin.   °· The contractions may go away when you walk around or change positions while lying down.   °· The contractions get weaker and are shorter lasting as time goes on.   °· The contractions do not usually become progressively stronger, regular, and closer together as with true labor.   °True Labor °· Contractions in true labor last 30-70 seconds, become very regular, usually become more intense, and increase in frequency.   °· The contractions do not go away with walking.   °· The discomfort is usually felt in the top of the uterus and spreads to the lower abdomen and low back.   °· True labor can be determined by your health care provider with an exam. This will show that the cervix is dilating and getting thinner.   °WHAT TO REMEMBER °· Keep up with your usual exercises and follow   other instructions given by your health care provider.   °· Take medicines as directed by your health care provider.   °· Keep your regular prenatal appointments.   °· Eat and drink lightly if you think you are going into labor.   °· If Braxton Hicks contractions are making you uncomfortable:   °· Change your  position from lying down or resting to walking, or from walking to resting.   °· Sit and rest in a tub of warm water.   °· Drink 2-3 glasses of water. Dehydration may cause these contractions.   °· Do slow and deep breathing several times an hour.   °WHEN SHOULD I SEEK IMMEDIATE MEDICAL CARE? °Seek immediate medical care if: °· Your contractions become stronger, more regular, and closer together.   °· You have fluid leaking or gushing from your vagina.   °· You have a fever.   °· You pass blood-tinged mucus.   °· You have vaginal bleeding.   °· You have continuous abdominal pain.   °· You have low back pain that you never had before.   °· You feel your baby's head pushing down and causing pelvic pressure.   °· Your baby is not moving as much as it used to.   °  °This information is not intended to replace advice given to you by your health care provider. Make sure you discuss any questions you have with your health care provider. °  °Document Released: 06/30/2005 Document Revised: 07/05/2013 Document Reviewed: 04/11/2013 °Elsevier Interactive Patient Education ©2016 [employer] ° °Preterm Labor Information °Preterm labor is when labor starts at less than 37 weeks of pregnancy. The normal length of a pregnancy is 39 to 41 weeks. °CAUSES °Often, there is no identifiable underlying cause as to why a woman goes into preterm labor. One of the most common known causes of preterm labor is infection. Infections of the uterus, cervix, vagina, amniotic sac, bladder, kidney, or even the lungs (pneumonia) can cause labor to start. Other suspected causes of preterm labor include:  °· Urogenital infections, such as yeast infections and bacterial vaginosis.   °· Uterine abnormalities (uterine shape, uterine septum, fibroids, or bleeding from the placenta).   °· A cervix that has been operated on (it may fail to stay closed).   °· Malformations in the fetus.   °· Multiple gestations (twins, triplets, and so on).   °· Breakage of  the amniotic sac.   °RISK FACTORS °· Having a previous history of preterm labor.   °· Having premature rupture of membranes (PROM).   °· Having a placenta that covers the opening of the cervix (placenta previa).   °· Having a placenta that separates from the uterus (placental abruption).   °· Having a cervix that is too weak to hold the fetus in the uterus (incompetent cervix).   °· Having too much fluid in the amniotic sac (polyhydramnios).   °· Taking illegal drugs or smoking while pregnant.   °· Not gaining enough weight while pregnant.   °· Being younger than 18 and older than 35 years old.   °· Having a low socioeconomic status.   °· Being African American. °SYMPTOMS °Signs and symptoms of preterm labor include:  °· Menstrual-like cramps, abdominal pain, or back pain. °· Uterine contractions that are regular, as frequent as six in an hour, regardless of their intensity (may be mild or painful). °· Contractions that start on the top of the uterus and spread down to the lower abdomen and back.   °· A sense of increased pelvic pressure.   °· A watery or bloody mucus discharge that comes from the vagina.   °TREATMENT °Depending on the length of the pregnancy and   other circumstances, your health care provider may suggest bed rest. If necessary, there are medicines that can be given to stop contractions and to mature the fetal lungs. If labor happens before 34 weeks of pregnancy, a prolonged hospital stay may be recommended. Treatment depends on the condition of both you and the fetus.  °WHAT SHOULD YOU DO IF YOU THINK YOU ARE IN PRETERM LABOR? °Call your health care provider right away. You will need to go to the hospital to get checked immediately. °HOW CAN YOU PREVENT PRETERM LABOR IN FUTURE PREGNANCIES? °You should:  °· Stop smoking if you smoke.  °· Maintain healthy weight gain and avoid chemicals and drugs that are not necessary. °· Be watchful for any type of infection. °· Inform your health care provider if  you have a known history of preterm labor. °  °This information is not intended to replace advice given to you by your health care provider. Make sure you discuss any questions you have with your health care provider. °  °Document Released: 09/20/2003 Document Revised: 03/02/2013 Document Reviewed: 08/02/2012 °Elsevier Interactive Patient Education ©2016 [employer] ° °

## 2015-07-17 NOTE — [facility] Note (Signed)
Scheduled for c-section on Jan 30; sent from [facility] to [facility] for complaints of cramping; hx of hypertension and type II diabetes;

## 2015-07-24 ENCOUNTER — Ambulatory Visit (HOSPITAL_COMMUNITY)
Admission: RE | Admit: 2015-07-24 | Discharge: 2015-07-24 | Disposition: A | Discharge status: Home / Self Care | Payer: Medicaid Other | Source: Ambulatory Visit | Attending: Unknown Physician Specialty | Admitting: Unknown Physician Specialty

## 2015-07-24 ENCOUNTER — Encounter (HOSPITAL_COMMUNITY): Payer: Self-pay

## 2015-07-24 DIAGNOSIS — O99213 Obesity complicating pregnancy, third trimester: Secondary | ICD-10-CM | POA: Insufficient documentation

## 2015-07-24 DIAGNOSIS — E119 Type 2 diabetes mellitus without complications: Secondary | ICD-10-CM

## 2015-07-24 DIAGNOSIS — O09293 Supervision of pregnancy with other poor reproductive or obstetric history, third trimester: Secondary | ICD-10-CM | POA: Insufficient documentation

## 2015-07-24 DIAGNOSIS — O34219 Maternal care for unspecified type scar from previous cesarean delivery: Secondary | ICD-10-CM | POA: Insufficient documentation

## 2015-07-24 DIAGNOSIS — O24113 Pre-existing diabetes mellitus, type 2, in pregnancy, third trimester: Secondary | ICD-10-CM | POA: Insufficient documentation

## 2015-07-24 DIAGNOSIS — O24919 Unspecified diabetes mellitus in pregnancy, unspecified trimester: Secondary | ICD-10-CM

## 2015-07-24 DIAGNOSIS — Z3A36 36 weeks gestation of pregnancy: Secondary | ICD-10-CM | POA: Insufficient documentation

## 2015-07-24 DIAGNOSIS — O99843 Bariatric surgery status complicating pregnancy, third trimester: Secondary | ICD-10-CM | POA: Diagnosis not present

## 2015-07-24 DIAGNOSIS — O09523 Supervision of elderly multigravida, third trimester: Secondary | ICD-10-CM | POA: Diagnosis not present

## 2015-07-24 DIAGNOSIS — O10013 Pre-existing essential hypertension complicating pregnancy, third trimester: Secondary | ICD-10-CM | POA: Diagnosis not present

## 2015-07-24 NOTE — ED Notes (Signed)
Report called to office regarding patients elevated blood pressures while in MFM.  RN will make MD aware.  Pt has an appt in the office in the AM.

## 2015-07-24 NOTE — ED Notes (Signed)
[facility] per [facility]. Sherrie Georgeecker.

## 2015-07-31 ENCOUNTER — Ambulatory Visit (HOSPITAL_COMMUNITY)
Admission: RE | Admit: 2015-07-31 | Discharge: 2015-07-31 | Disposition: A | Discharge status: Home / Self Care | Payer: Medicaid Other | Source: Ambulatory Visit | Attending: Unknown Physician Specialty | Admitting: Unknown Physician Specialty

## 2015-07-31 DIAGNOSIS — O09523 Supervision of elderly multigravida, third trimester: Secondary | ICD-10-CM | POA: Diagnosis present

## 2015-07-31 DIAGNOSIS — E119 Type 2 diabetes mellitus without complications: Secondary | ICD-10-CM

## 2015-07-31 DIAGNOSIS — O10013 Pre-existing essential hypertension complicating pregnancy, third trimester: Secondary | ICD-10-CM | POA: Insufficient documentation

## 2015-07-31 DIAGNOSIS — O34219 Maternal care for unspecified type scar from previous cesarean delivery: Secondary | ICD-10-CM | POA: Insufficient documentation

## 2015-07-31 DIAGNOSIS — O99843 Bariatric surgery status complicating pregnancy, third trimester: Secondary | ICD-10-CM | POA: Diagnosis not present

## 2015-07-31 DIAGNOSIS — O24919 Unspecified diabetes mellitus in pregnancy, unspecified trimester: Secondary | ICD-10-CM

## 2015-07-31 DIAGNOSIS — Z3A37 37 weeks gestation of pregnancy: Secondary | ICD-10-CM | POA: Diagnosis not present

## 2015-07-31 DIAGNOSIS — O24113 Pre-existing diabetes mellitus, type 2, in pregnancy, third trimester: Secondary | ICD-10-CM | POA: Diagnosis not present

## 2015-07-31 DIAGNOSIS — O99213 Obesity complicating pregnancy, third trimester: Secondary | ICD-10-CM | POA: Diagnosis not present

## 2015-08-07 ENCOUNTER — Ambulatory Visit (HOSPITAL_COMMUNITY): Payer: Medicaid Other

## 2015-12-29 ENCOUNTER — Encounter (HOSPITAL_COMMUNITY): Payer: Self-pay | Admitting: *Deleted

## 2016-10-21 IMAGING — US US MFM OB FOLLOW-UP
1 series · 14 of 28 positions shown · non-contrast
Comparison: none

[Series 1: us mfm ob follow-up · 14 of 44 slices shown]
[im 2/44]
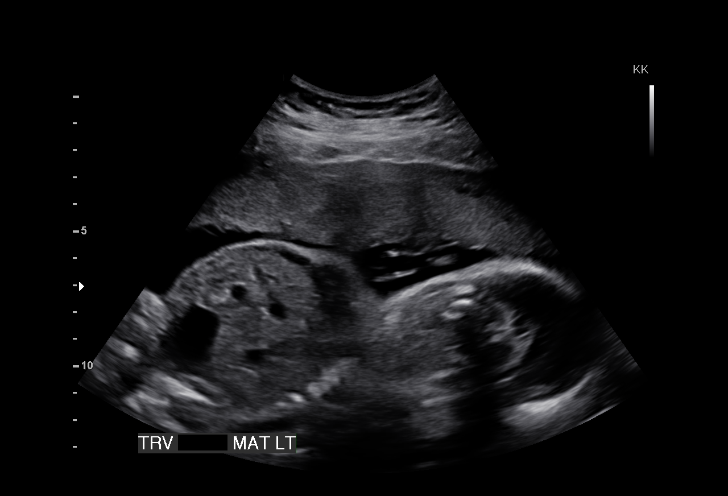
[im 5/44]
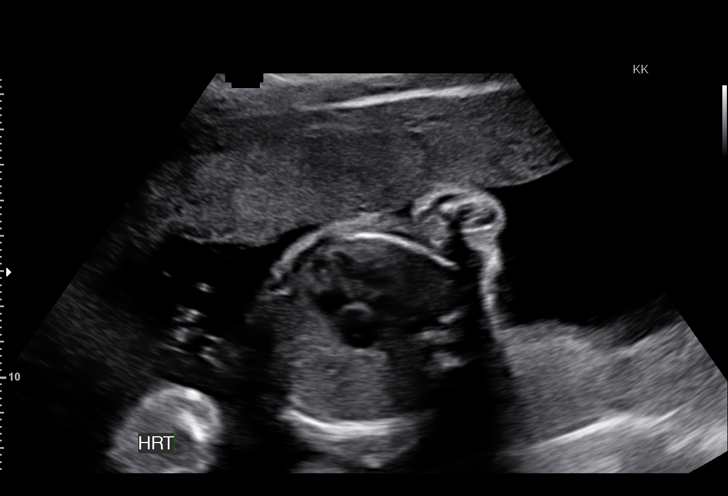
[im 8/44]
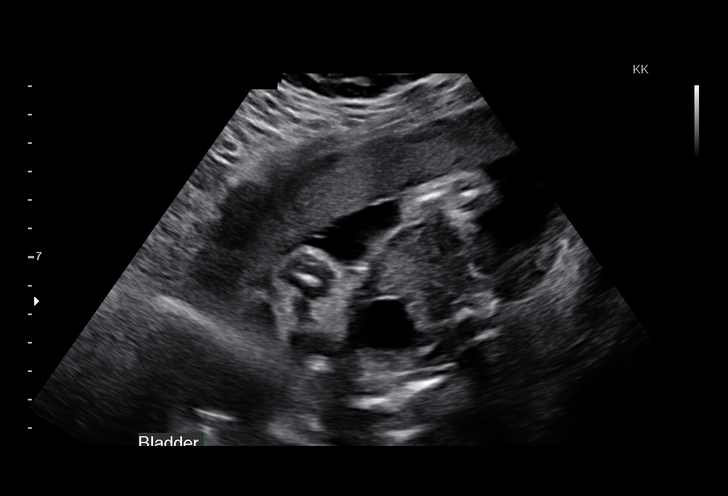
[im 12/44]
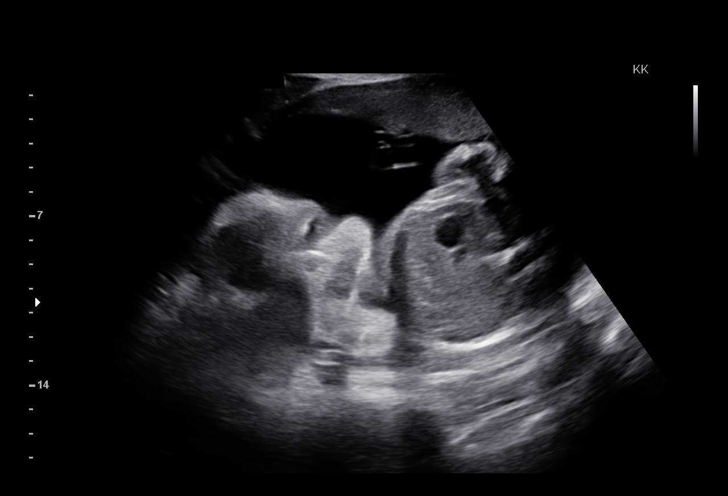
[im 15/44]
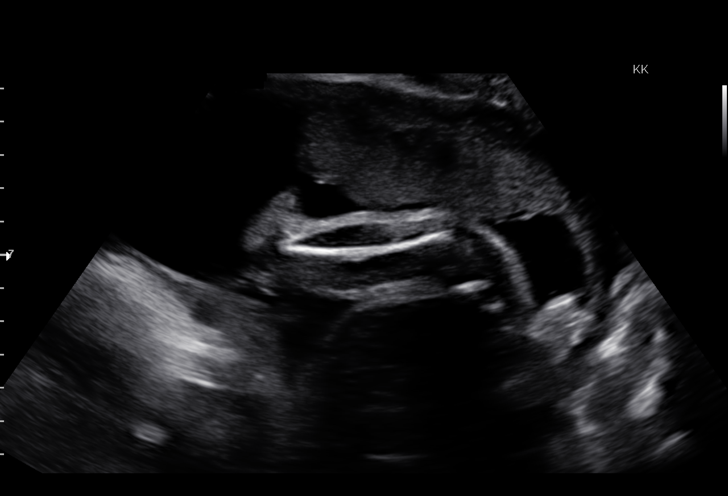
[im 18/44]
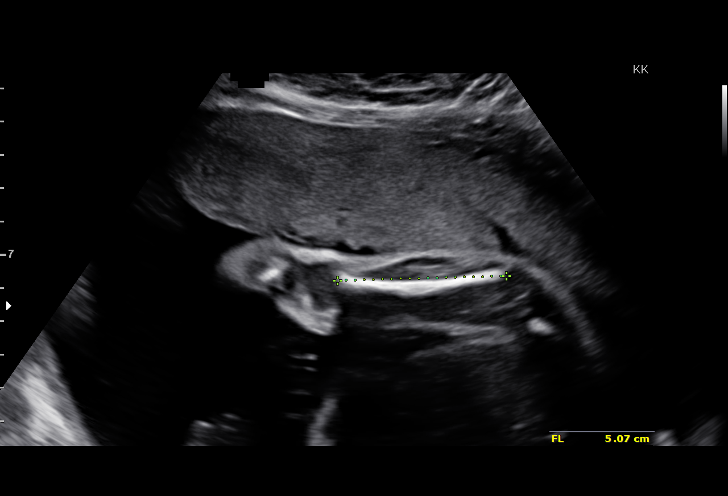
[im 21/44]
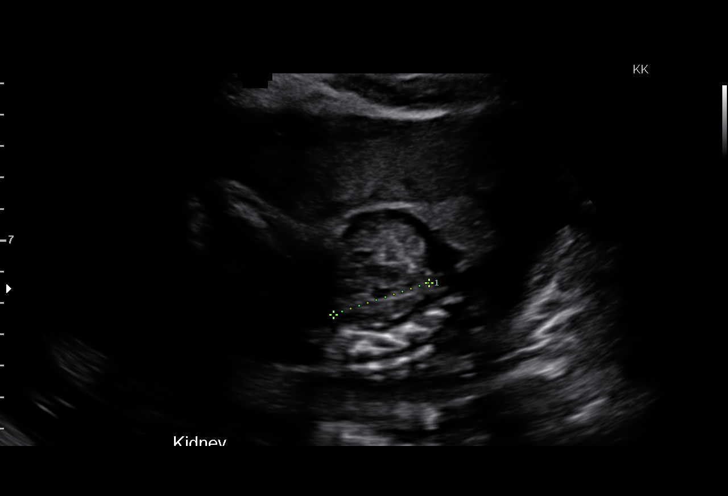
[im 24/44]
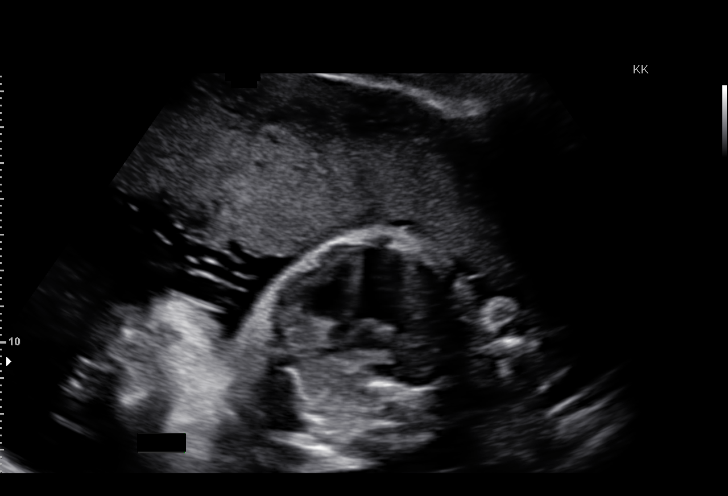
[im 28/44]
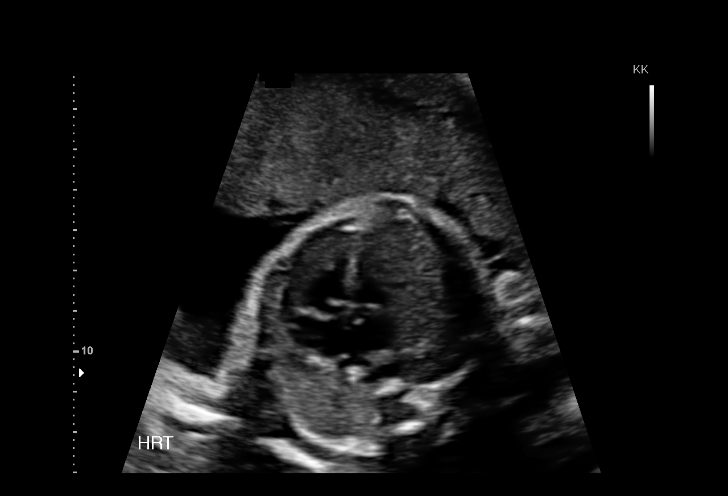
[im 31/44]
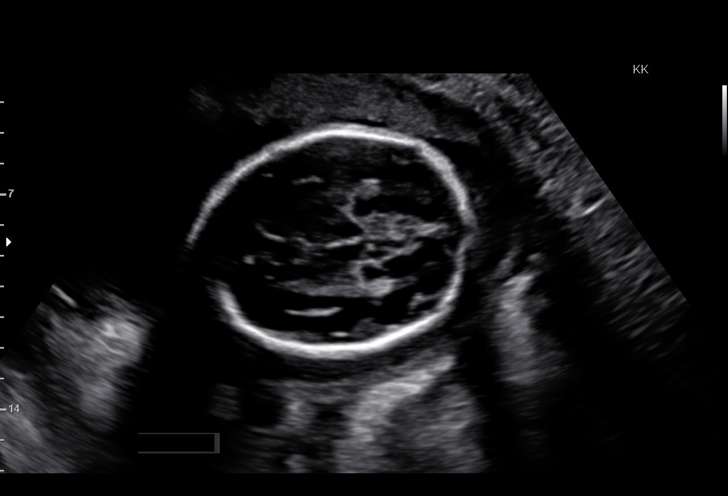
[im 34/44]
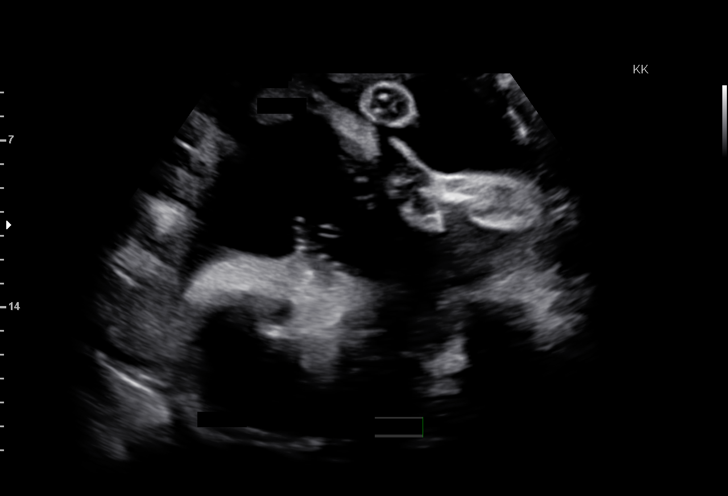
[im 37/44]
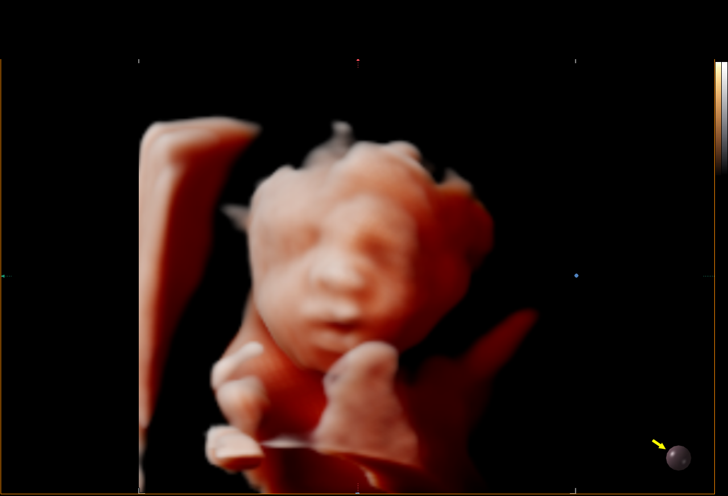
[im 40/44]
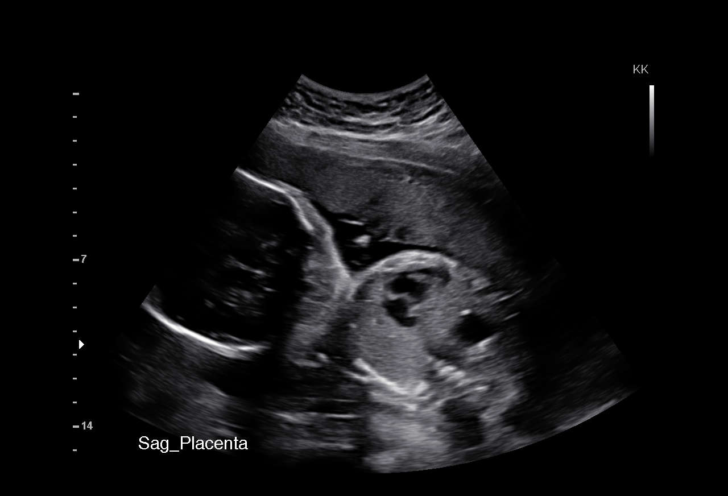
[im 44/44]
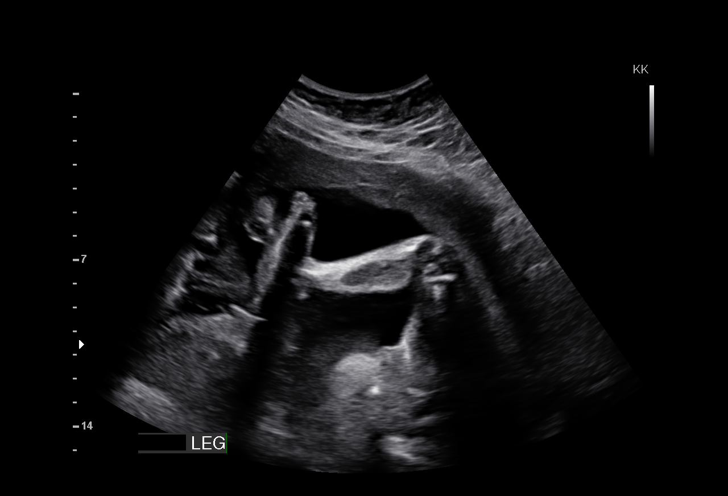

[14 of 28 positions shown; findings below may reference images not displayed]

OBSTETRICS REPORT
(Signed Final 05/22/2015 [DATE])

Date:

Service(s) Provided

Indications

Advanced maternal age multigravida 35+, second
trimester
Pre-existing diabetes, type 2, in pregnancy,
second trimester-normal ECHO; oral meds and
insulin
Hypertension - Chronic/Pre-existing; not requiring
meds currently
Follow-up incomplete fetal anatomic evaluation         Z36
Obesity complicating pregnancy, second
trimester
History of gastric bypass
27 weeks gestation of pregnancy
Fetal Evaluation

Num Of             1
Fetuses:
Fetal Heart        145                          bpm
Rate:
Cardiac Activity:  Observed
Presentation:      Transverse, head to
maternal left
Placenta:          Anterior, above cervical
os
P. Cord            Previously Visualized
Insertion:

Amniotic Fluid
AFI FV:      Subjectively within normal limits
Larg Pckt:      7.9  cm
Biometry

BPD:     70.7   m    G. Age:   28w 3d                 CI:        73.46   70 - 86
m
FL/HC:      19.4   18.6 -
20.4
HC:     262.1   m    G. Age:   28w 4d        61  %    HC/AC:      1.13   1.05 -
m
AC:       231   m    G. Age:   27w 4d        47  %    FL/BPD      72.0   71 - 87
m                                     :
FL:      50.9   m    G. Age:   27w 2d        35  %    FL/AC:      22.0   20 - 24
m

Est.        8185   gm    2 lb 7 oz      58   %
FW:
Gestational Age

U/S Today:     28w 0d                                         EDD:   08/14/15
Best:          27w 2d    Det. By:   Early Ultrasound          EDD:   08/19/15
(02/07/15)
Anatomy

Cranium:          Appears normal         Aortic Arch:       Appears normal
Fetal Cavum:      Appears normal         Ductal Arch:       Appears normal
Ventricles:       Appears normal         Diaphragm:         Previously seen
Choroid Plexus:   Previously seen        Stomach:           Appears normal,
left sided
Cerebellum:       Previously seen        Abdomen:           Previously seen
Posterior         Previously seen        Abdominal          Previously seen
Fossa:                                   Wall:
Nuchal Fold:      Previously seen        Cord Vessels:      Previously seen
Face:             Orbits and profile     Kidneys:           Appear normal
previously seen
Lips:             Previously seen        Bladder:           Appears normal
Palate:           Previously seen        Spine:             Previously seen
Heart:            Appears normal         Lower              Previously seen
(4CH, axis, and        Extremities:
situs)
RVOT:             Previously seen        Upper              Previously seen
Extremities:
LVOT:             Appears normal

Other:   Fetus appears to be a female. Heels and RT 5th digit appear
normal previously. Technically difficult due to maternal habitus and
fetal position.
Cervix Uterus Adnexa

Cervical Length:    3.8       cm

Cervix:       Normal appearance by transabdominal scan.
Impression

Single IUP at 27w 2d
Type 2 diabetes, CHTN, hx of gastric bypass- had normal
fetal echo
Normal interval anatomy
Fetal growth is appropriate (58th %tile)
Normal amniotic fluid volume
Recommendations

Recommend follow-up ultrasound examination in 4 weeks
for interval growth
Recommend antenatal testing beginning at 32 weeks

## 2016-12-23 IMAGING — US US MFM FETAL BPP W/O NON-STRESS
1 series · 15 of 16 positions shown · non-contrast
Comparison: none

[Series 1: us mfm fetal bpp w/o non-stress · 16 acquisitions, 15 frames shown]
[im 1/16]
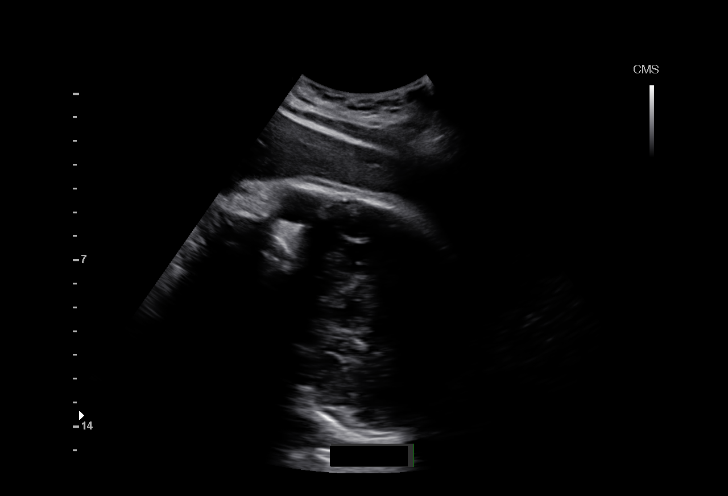
[im 2/16]
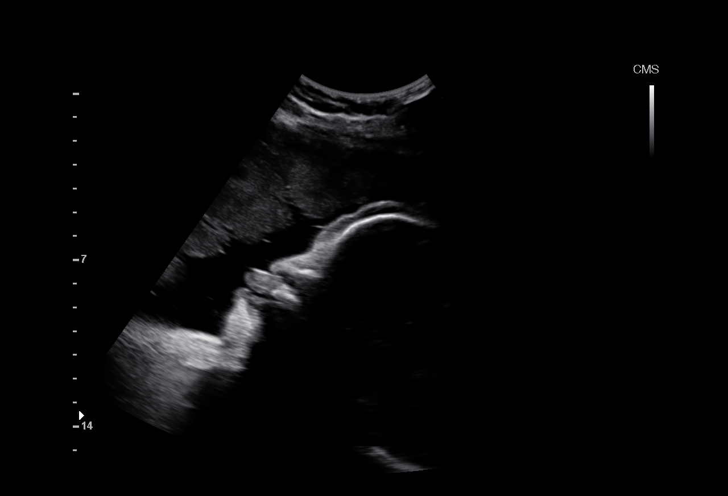
[im 3/16]
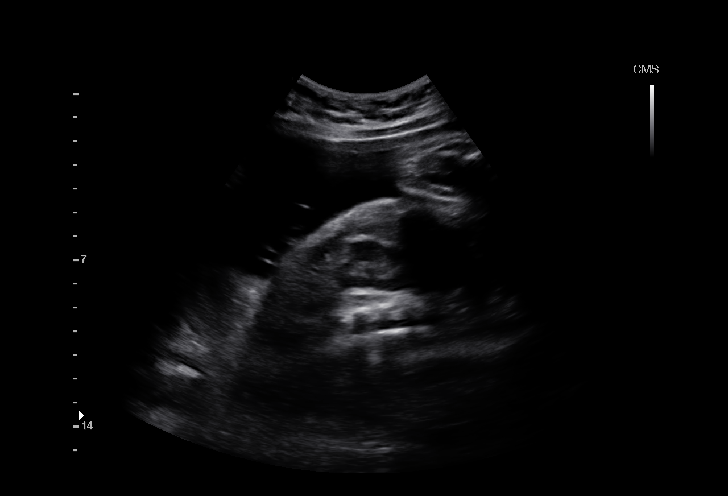
[im 4/16]
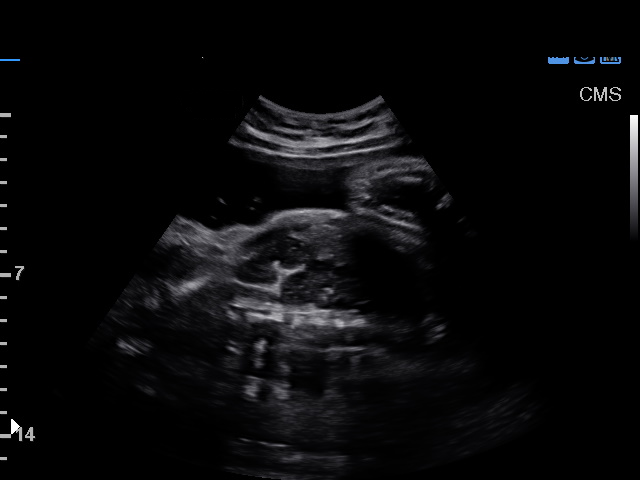
[im 5/16]
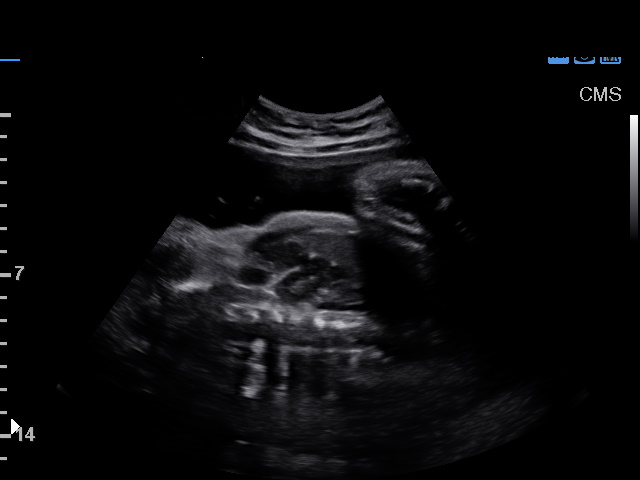
[im 6/16]
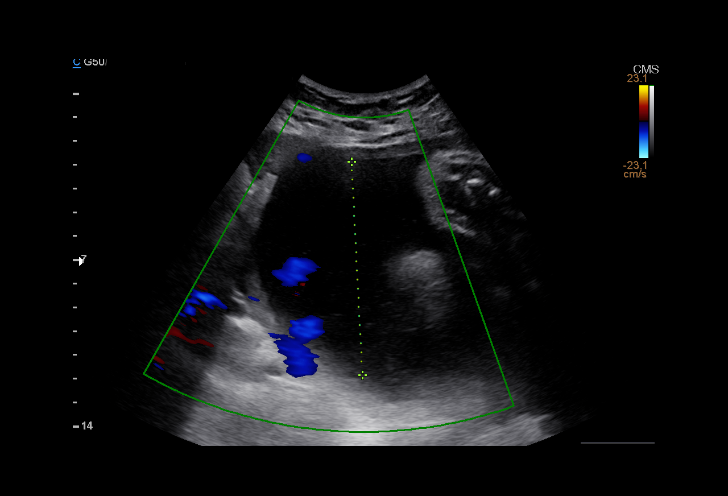
[im 7/16]
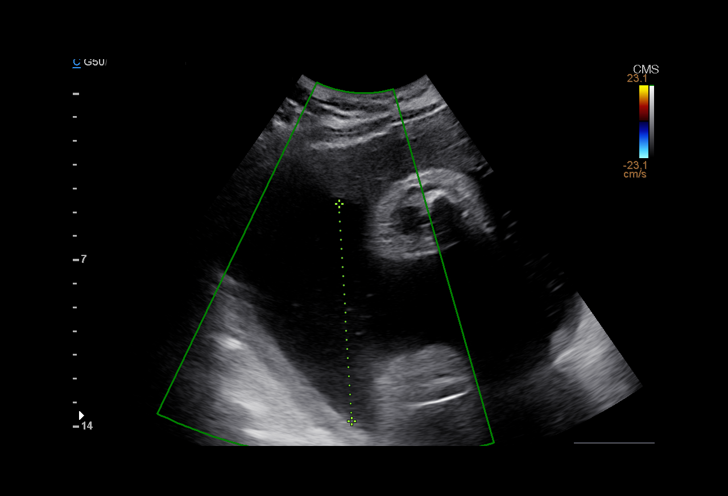
[im 9/16]
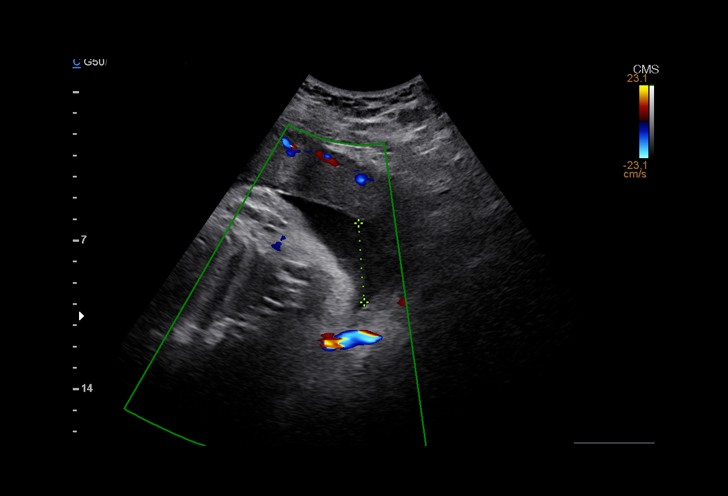
[im 10/16]
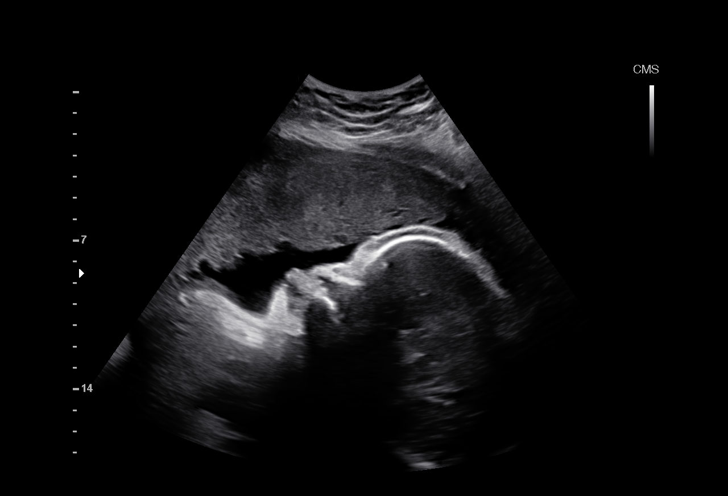
[im 11/16]
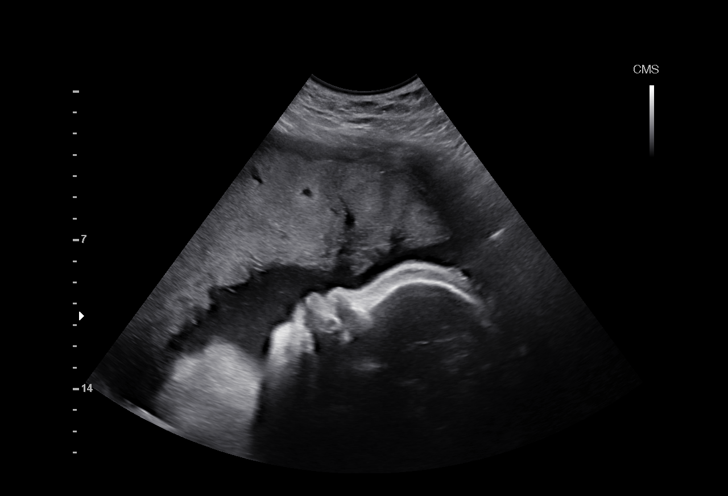
[im 12/16]
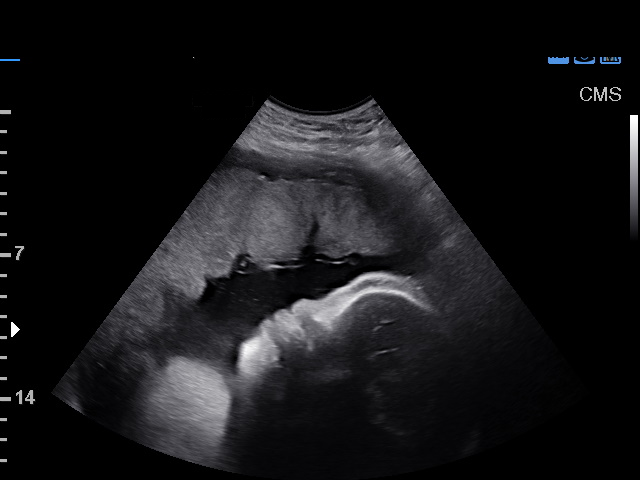
[im 13/16]
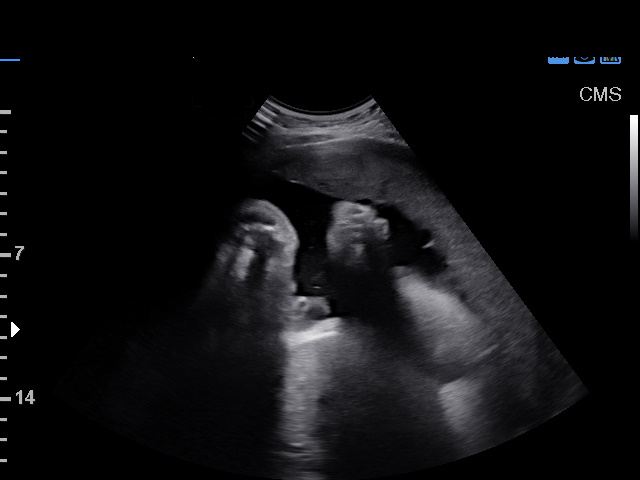
[im 14/16]
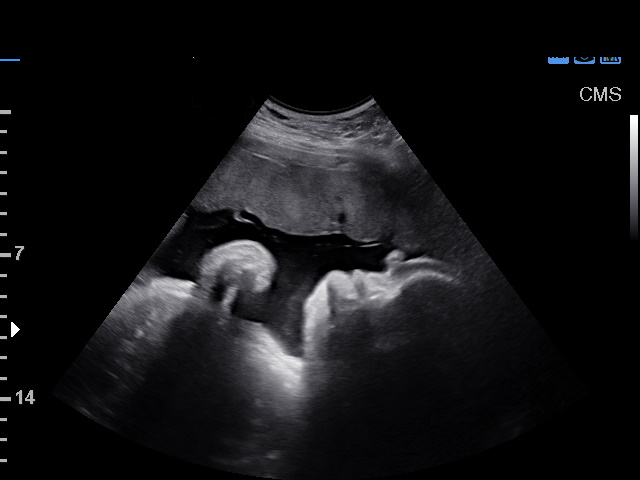
[im 15/16]
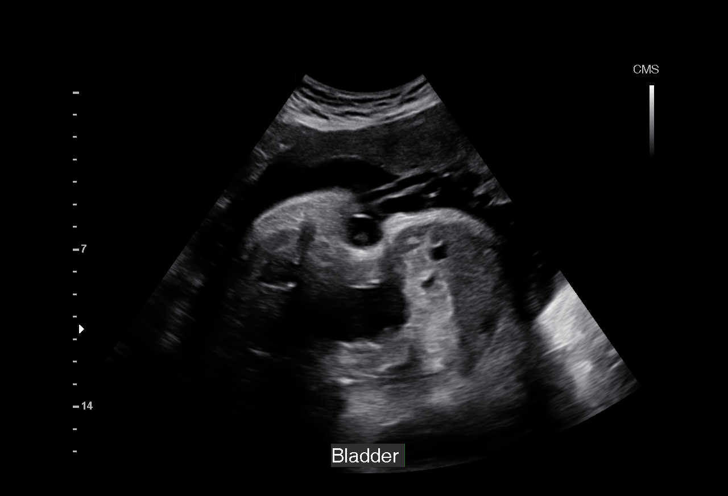
[im 16/16]
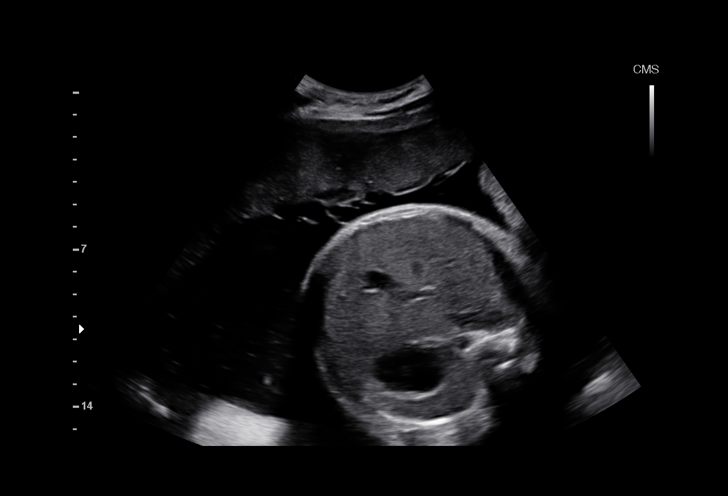

[15 of 16 positions shown; findings below may reference images not displayed]

pm)

Date:

[REDACTED] [id number]

1  Cheto Stock            [id number]       [id number]     [id number]
Indications

36 weeks gestation of pregnancy
Advanced maternal age multigravida 34,
third trimester - normal first trimester screen
Pre-existing diabetes, type 2, in
pregnancy, third trimester - normal ECHO;
oral meds and insulin
Hypertension - Chronic/Pre-existing (no
meds)
Obesity complicating pregnancy, third
trimester
Pregnancy complicated by previous gastric
bypass, antepartum, third trimester
Previous cesarean delivery x 2, antepartum
Poor obstetric history: Previous
preeclampsia
OB History

Gravidity:     3         Term:  1        Prem:    1        SAB:   0
TOP:           0       Ectopic  0        Living:  2
:
Fetal Evaluation
Num Of Fetuses:      1
Fetal Heart          161
Rate(bpm):
Cardiac Activity:    Observed
Presentation:        Cephalic

Amniotic Fluid
AFI FV:      Polyhydramnios
AFI Sum:     29.6     cm    > 97  %Tile     Larg Pckt:    9.15   cm
RUQ:   8.98    cm    RLQ:   3.73    cm   LUQ:    9.15    cm   LLQ:    7.74   cm
Biophysical Evaluation

Amniotic F.V:   Polyhydramnios              F. Tone:        Observed
F. Movement:    Observed                    Score:          [DATE]
F. Breathing:   Observed
Gestational Age

Best:          36w 2d    Det. By:   Early Ultrasound          EDD:   08/19/15
(02/07/15)
Impression

SIUP at 36+2 weeks
Mild polyhydramnios
BPP [DATE]
Recommendations

Continue weekly testing

## 2019-05-12 ENCOUNTER — Ambulatory Visit (HOSPITAL_COMMUNITY): Payer: Self-pay | Admitting: Hematology

## 2019-05-30 ENCOUNTER — Other Ambulatory Visit: Payer: Self-pay

## 2019-05-30 ENCOUNTER — Encounter (HOSPITAL_COMMUNITY): Payer: Self-pay

## 2019-05-31 ENCOUNTER — Inpatient Hospital Stay (HOSPITAL_COMMUNITY): Payer: Medicaid Other | Attending: Hematology

## 2019-05-31 ENCOUNTER — Inpatient Hospital Stay (HOSPITAL_COMMUNITY): Payer: Medicaid Other | Attending: Hematology | Admitting: Hematology

## 2019-05-31 ENCOUNTER — Encounter (HOSPITAL_COMMUNITY): Payer: Self-pay | Admitting: Hematology

## 2019-05-31 VITALS — BP 135/91 | HR 99 | Temp 97.3°F | Resp 18 | Wt 221.0 lb

## 2019-05-31 DIAGNOSIS — R5383 Other fatigue: Secondary | ICD-10-CM | POA: Diagnosis not present

## 2019-05-31 DIAGNOSIS — I10 Essential (primary) hypertension: Secondary | ICD-10-CM | POA: Diagnosis not present

## 2019-05-31 DIAGNOSIS — Z800 Family history of malignant neoplasm of digestive organs: Secondary | ICD-10-CM | POA: Insufficient documentation

## 2019-05-31 DIAGNOSIS — E669 Obesity, unspecified: Secondary | ICD-10-CM | POA: Insufficient documentation

## 2019-05-31 DIAGNOSIS — Z836 Family history of other diseases of the respiratory system: Secondary | ICD-10-CM | POA: Diagnosis not present

## 2019-05-31 DIAGNOSIS — D649 Anemia, unspecified: Secondary | ICD-10-CM | POA: Diagnosis present

## 2019-05-31 DIAGNOSIS — F329 Major depressive disorder, single episode, unspecified: Secondary | ICD-10-CM | POA: Diagnosis not present

## 2019-05-31 DIAGNOSIS — E559 Vitamin D deficiency, unspecified: Secondary | ICD-10-CM | POA: Insufficient documentation

## 2019-05-31 DIAGNOSIS — N939 Abnormal uterine and vaginal bleeding, unspecified: Secondary | ICD-10-CM | POA: Insufficient documentation

## 2019-05-31 DIAGNOSIS — Z83438 Family history of other disorder of lipoprotein metabolism and other lipidemia: Secondary | ICD-10-CM | POA: Insufficient documentation

## 2019-05-31 DIAGNOSIS — Z8041 Family history of malignant neoplasm of ovary: Secondary | ICD-10-CM | POA: Diagnosis not present

## 2019-05-31 DIAGNOSIS — R0602 Shortness of breath: Secondary | ICD-10-CM | POA: Diagnosis not present

## 2019-05-31 DIAGNOSIS — E785 Hyperlipidemia, unspecified: Secondary | ICD-10-CM | POA: Diagnosis not present

## 2019-05-31 DIAGNOSIS — Z8379 Family history of other diseases of the digestive system: Secondary | ICD-10-CM | POA: Insufficient documentation

## 2019-05-31 DIAGNOSIS — Z833 Family history of diabetes mellitus: Secondary | ICD-10-CM | POA: Insufficient documentation

## 2019-05-31 DIAGNOSIS — Z79899 Other long term (current) drug therapy: Secondary | ICD-10-CM | POA: Insufficient documentation

## 2019-05-31 DIAGNOSIS — E538 Deficiency of other specified B group vitamins: Secondary | ICD-10-CM | POA: Diagnosis not present

## 2019-05-31 DIAGNOSIS — Z7984 Long term (current) use of oral hypoglycemic drugs: Secondary | ICD-10-CM | POA: Insufficient documentation

## 2019-05-31 DIAGNOSIS — Z9884 Bariatric surgery status: Secondary | ICD-10-CM | POA: Diagnosis not present

## 2019-05-31 DIAGNOSIS — N920 Excessive and frequent menstruation with regular cycle: Secondary | ICD-10-CM | POA: Insufficient documentation

## 2019-05-31 DIAGNOSIS — D500 Iron deficiency anemia secondary to blood loss (chronic): Secondary | ICD-10-CM | POA: Diagnosis present

## 2019-05-31 DIAGNOSIS — Z809 Family history of malignant neoplasm, unspecified: Secondary | ICD-10-CM | POA: Diagnosis not present

## 2019-05-31 DIAGNOSIS — E119 Type 2 diabetes mellitus without complications: Secondary | ICD-10-CM | POA: Diagnosis not present

## 2019-05-31 DIAGNOSIS — Z8249 Family history of ischemic heart disease and other diseases of the circulatory system: Secondary | ICD-10-CM | POA: Diagnosis not present

## 2019-05-31 LAB — CBC WITH DIFFERENTIAL/PLATELET
Abs Immature Granulocytes: 0.01 10*3/uL (ref 0.00–0.07)
Basophils Absolute: 0 10*3/uL (ref 0.0–0.1)
Basophils Relative: 1 %
Eosinophils Absolute: 0.4 10*3/uL (ref 0.0–0.5)
Eosinophils Relative: 6 %
HCT: 31.9 % — ABNORMAL LOW (ref 36.0–46.0)
Hemoglobin: 9.5 g/dL — ABNORMAL LOW (ref 12.0–15.0)
Immature Granulocytes: 0 %
Lymphocytes Relative: 27 %
Lymphs Abs: 1.5 10*3/uL (ref 0.7–4.0)
MCH: 26.2 pg (ref 26.0–34.0)
MCHC: 29.8 g/dL — ABNORMAL LOW (ref 30.0–36.0)
MCV: 87.9 fL (ref 80.0–100.0)
Monocytes Absolute: 0.4 10*3/uL (ref 0.1–1.0)
Monocytes Relative: 7 %
Neutro Abs: 3.3 10*3/uL (ref 1.7–7.7)
Neutrophils Relative %: 59 %
Platelets: 327 10*3/uL (ref 150–400)
RBC: 3.63 MIL/uL — ABNORMAL LOW (ref 3.87–5.11)
RDW: 14.6 % (ref 11.5–15.5)
WBC: 5.6 10*3/uL (ref 4.0–10.5)
nRBC: 0 % (ref 0.0–0.2)

## 2019-05-31 LAB — COMPREHENSIVE METABOLIC PANEL
ALT: 13 U/L (ref 0–44)
AST: 19 U/L (ref 15–41)
Albumin: 4 g/dL (ref 3.5–5.0)
Alkaline Phosphatase: 51 U/L (ref 38–126)
Anion gap: 8 (ref 5–15)
BUN: 10 mg/dL (ref 6–20)
CO2: 24 mmol/L (ref 22–32)
Calcium: 8.6 mg/dL — ABNORMAL LOW (ref 8.9–10.3)
Chloride: 103 mmol/L (ref 98–111)
Creatinine, Ser: 0.73 mg/dL (ref 0.44–1.00)
GFR calc Af Amer: >60 mL/min (ref >60)
GFR calc non Af Amer: >60 mL/min (ref >60)
Glucose, Bld: 105 mg/dL — ABNORMAL HIGH (ref 70–99)
Potassium: 4.1 mmol/L (ref 3.5–5.1)
Sodium: 135 mmol/L (ref 135–145)
Total Bilirubin: 0.2 mg/dL — ABNORMAL LOW (ref 0.3–1.2)
Total Protein: 7.1 g/dL (ref 6.5–8.1)

## 2019-05-31 LAB — IRON AND TIBC
Iron: 21 ug/dL — ABNORMAL LOW (ref 28–170)
Saturation Ratios: 5 % — ABNORMAL LOW (ref 10.4–31.8)
TIBC: 434 ug/dL (ref 250–450)
UIBC: 413 ug/dL

## 2019-05-31 LAB — FERRITIN: Ferritin: 3 ng/mL — ABNORMAL LOW (ref 11–307)

## 2019-05-31 LAB — FOLATE: Folate: 10.5 ng/mL (ref >5.9)

## 2019-05-31 LAB — VITAMIN B12: Vitamin B-12: 68 pg/mL — ABNORMAL LOW (ref 180–914)

## 2019-05-31 NOTE — Progress Notes (Signed)
[facility] Initial Visit:  Patient Care Team: Renee Rival, NP as PCP - General (Nurse Practitioner)  CHIEF COMPLAINTS/PURPOSE OF CONSULTATION: - Anemia    HISTORY OF PRESENTING ILLNESS: Jaclyn Vaughn 38 y.o. female presents today for consult regarding history of anemia.  She has a past medical history significant for hypertension, type 2 diabetes, hyperlipidemia, vitamin D deficiency, and depression.  Patient's recent follow-up with primary care provider revealed a hemoglobin of 9.1, hematocrit 31.8, MCV of 90.6.  In review of her medical record,  hemoglobin from September 2019 was 10.6 and has gradually trended down over the past year.  There are no iron studies to review.  Patient states she was told she was anemic approximately 3 years ago.  This was after the birth of her third child.  She has been on oral iron 3 times a day for the past 2 years.  She experiences heavy menstrual bleeding.  Her menstrual cycle last approximately 7 days every 28 days.  The first 2 days of her menstrual cycle she is unable to work due to the heavy bleeding.  She also states she passes large clots during the first 2 days.  Day 3 through 6 of her menstrual cycle was medium flow. Day 7 is a light flow.  She does report mild spotting in between cycles.  Patient states she does have a craving for pretzels.  She denies any other signs of bleeding.  She does report moderate fatigue.  She reports shortness of breath that occurs with exertion.  Shortness of breath resolves with rest.    Patient denies a history of tobacco or drug abuse.  She does have a family history significant for colon cancer in her maternal aunt and uncle and one maternal aunt with ovarian cancer.     Review of Systems  Constitutional: Positive for fatigue.  HENT:  Negative.   Eyes: Negative.   Respiratory: Negative.   Cardiovascular: Negative.   Gastrointestinal: Negative.   Genitourinary: Positive for vaginal  bleeding.   Musculoskeletal: Negative.   Skin: Negative.   Neurological: Negative.   Hematological: Negative.   Psychiatric/Behavioral: Negative.     MEDICAL HISTORY: Past Medical History:  Diagnosis Date  . Anemia   . Anxiety   . Depression   . Diabetes mellitus without complication ([place])   . Heart murmur   . History of vitamin D deficiency   . Hyperlipidemia   . Hypertension     SURGICAL HISTORY: Past Surgical History:  Procedure Laterality Date  . APPENDECTOMY    . CESAREAN SECTION  02/2005  . GASTRIC BYPASS    . HERNIA REPAIR    . lap wound exploration    . TUBAL LIGATION      SOCIAL HISTORY: Social History   Socioeconomic History  . Marital status: Single    Spouse name: Not on file  . Number of children: 3  . Years of education: Not on file  . Highest education level: Not on file  Occupational History  . Occupation: Employed  Scientific laboratory technician  . Financial resource strain: Not on file  . Food insecurity    Worry: Not on file    Inability: Not on file  . Transportation needs    Medical: Not on file    Non-medical: Not on file  Tobacco Use  . Smoking status: Never Smoker  . Smokeless tobacco: Never Used  Substance and Sexual Activity  . Alcohol use: No  . Drug use: No  .  Sexual activity: Yes    Birth control/protection: Condom  Lifestyle  . Physical activity    Days per week: Not on file    Minutes per session: Not on file  . Stress: Not on file  Relationships  . Social Herbalist on phone: Not on file    Gets together: Not on file    Attends religious service: Not on file    Active member of club or organization: Not on file    Attends meetings of clubs or organizations: Not on file    Relationship status: Not on file  . Intimate partner violence    Fear of current or ex partner: Not on file    Emotionally abused: Not on file    Physically abused: Not on file    Forced sexual activity: Not on file  Other Topics Concern  . Not on  file  Social History Narrative  . Not on file    FAMILY HISTORY Family History  Problem Relation Age of Onset  . Diabetes Mother   . Hypertension Mother   . COPD Mother   . Hypertension Father   . Hyperlipidemia Father   . Healthy Daughter   . Healthy Son   . Obesity Sister   . Heart disease Brother   . Liver disease Brother   . Hypertension Maternal Grandmother   . Diabetes Maternal Grandmother   . Diverticulitis Maternal Grandmother   . Heart disease Maternal Grandfather   . Diabetes Paternal Grandmother   . Hypertension Paternal Grandmother   . Cancer Paternal Grandfather   . Obesity Sister   . Healthy Daughter     ALLERGIES:  has No Known Allergies.  MEDICATIONS:  Current Outpatient Medications  Medication Sig Dispense Refill  . acetaminophen (TYLENOL) 325 MG tablet Take 650 mg by mouth every 6 (six) hours as needed for moderate pain.    Marland Kitchen atorvastatin (LIPITOR) 10 MG tablet Take 10 mg by mouth daily.    Marland Kitchen buPROPion (WELLBUTRIN XL) 300 MG 24 hr tablet Take 300 mg by mouth daily.    . ferrous sulfate 325 (65 FE) MG tablet Take 325 mg by mouth 3 (three) times daily with meals.    Marland Kitchen lisinopril (ZESTRIL) 20 MG tablet Take 20 mg by mouth daily.    . metFORMIN (GLUCOPHAGE) 500 MG tablet Take 1 tablet (500 mg total) by mouth 2 (two) times daily with a meal. 60 tablet 2  . Multiple Vitamin (MULTIVITAMIN) capsule Take 1 capsule by mouth daily.    . Vitamin D, Ergocalciferol, (DRISDOL) 50000 units CAPS capsule Take 50,000 Units by mouth 2 (two) times a week.     No current facility-administered medications for this visit.     PHYSICAL EXAMINATION:  ECOG PERFORMANCE STATUS: 1 - Symptomatic but completely ambulatory   Vitals:   05/31/19 1454  BP: (!) 135/91  Pulse: 99  Resp: 18  Temp: (!) 97.3 F (36.3 C)  SpO2: 99%    Filed Weights   05/31/19 1454  Weight: 221 lb (100.2 kg)     Physical Exam Constitutional:      Appearance: Normal appearance. She is  obese.  HENT:     Head: Normocephalic.     Right Ear: External ear normal.     Left Ear: External ear normal.     Nose: Nose normal.     Mouth/Throat:     Mouth: Mucous membranes are moist.     Pharynx: Oropharynx is clear.  Eyes:  Conjunctiva/sclera: Conjunctivae normal.  Neck:     Musculoskeletal: Normal range of motion.  Cardiovascular:     Rate and Rhythm: Normal rate and regular rhythm.     Pulses: Normal pulses.     Heart sounds: Normal heart sounds.  Pulmonary:     Effort: Pulmonary effort is normal.     Breath sounds: Normal breath sounds.  Abdominal:     General: Bowel sounds are normal.  Musculoskeletal: Normal range of motion.  Skin:    General: Skin is warm.  Neurological:     General: No focal deficit present.     Mental Status: She is alert.  Psychiatric:        Mood and Affect: Mood normal.        Behavior: Behavior normal.        Thought Content: Thought content normal.        Judgment: Judgment normal.      LABORATORY DATA: I have personally reviewed the data as listed:  Appointment on 05/31/2019  Component Date Value Ref Range Status  . WBC 05/31/2019 5.6  4.0 - 10.5 K/uL Final  . RBC 05/31/2019 3.63* 3.87 - 5.11 MIL/uL Final  . Hemoglobin 05/31/2019 9.5* 12.0 - 15.0 g/dL Final  . HCT 05/31/2019 31.9* 36.0 - 46.0 % Final  . MCV 05/31/2019 87.9  80.0 - 100.0 fL Final  . MCH 05/31/2019 26.2  26.0 - 34.0 pg Final  . MCHC 05/31/2019 29.8* 30.0 - 36.0 g/dL Final  . RDW 05/31/2019 14.6  11.5 - 15.5 % Final  . Platelets 05/31/2019 327  150 - 400 K/uL Final  . nRBC 05/31/2019 0.0  0.0 - 0.2 % Final  . Neutrophils Relative % 05/31/2019 59  % Final  . Neutro Abs 05/31/2019 3.3  1.7 - 7.7 K/uL Final  . Lymphocytes Relative 05/31/2019 27  % Final  . Lymphs Abs 05/31/2019 1.5  0.7 - 4.0 K/uL Final  . Monocytes Relative 05/31/2019 7  % Final  . Monocytes Absolute 05/31/2019 0.4  0.1 - 1.0 K/uL Final  . Eosinophils Relative 05/31/2019 6  % Final  .  Eosinophils Absolute 05/31/2019 0.4  0.0 - 0.5 K/uL Final  . Basophils Relative 05/31/2019 1  % Final  . Basophils Absolute 05/31/2019 0.0  0.0 - 0.1 K/uL Final  . Immature Granulocytes 05/31/2019 0  % Final  . Abs Immature Granulocytes 05/31/2019 0.01  0.00 - 0.07 K/uL Final   Performed at [facility], [address], [place], [place] [address]  . Sodium 05/31/2019 135  135 - 145 mmol/L Final  . Potassium 05/31/2019 4.1  3.5 - 5.1 mmol/L Final  . Chloride 05/31/2019 103  98 - 111 mmol/L Final  . CO2 05/31/2019 24  22 - 32 mmol/L Final  . Glucose, Bld 05/31/2019 105* 70 - 99 mg/dL Final  . BUN 05/31/2019 10  6 - 20 mg/dL Final  . Creatinine, Ser 05/31/2019 0.73  0.44 - 1.00 mg/dL Final  . Calcium 05/31/2019 8.6* 8.9 - 10.3 mg/dL Final  . Total Protein 05/31/2019 7.1  6.5 - 8.1 g/dL Final  . Albumin 05/31/2019 4.0  3.5 - 5.0 g/dL Final  . AST 05/31/2019 19  15 - 41 U/L Final  . ALT 05/31/2019 13  0 - 44 U/L Final  . Alkaline Phosphatase 05/31/2019 51  38 - 126 U/L Final  . Total Bilirubin 05/31/2019 0.2* 0.3 - 1.2 mg/dL Final  . GFR calc non Af Amer 05/31/2019 >60  >60 mL/min Final  .  GFR calc Af Amer 05/31/2019 >60  >60 mL/min Final  . Anion gap 05/31/2019 8  5 - 15 Final   Performed at [facility], [address], [place], [place] [address]  . Folate 05/31/2019 10.5  >5.9 ng/mL Final   Performed at [facility], [address] [place], [place] [address]      Normocytic anemia 1.  Normocytic anemia -Most likely secondary to heavy menstrual bleeding -Patient apparently developed anemia 3 years ago after childbirth. -Patient experiences heavy menstrual bleeding for 1-2 of 7-day cycle occurring every 28 days. -Hemoglobin in September 2019 was 10.6 with MCV of 91.5.  Most recent hemoglobin from October 2020 was 9.1 with MCV of 90.6. -Currently on p.o. iron 3 times daily. -Today, we discussed differential diagnosis of anemia including but not limited to mineral deficiency, acute blood  loss, autoimmune disorder, chronic kidney disease, chronic inflammation, medications, or bone marrow process.  In her case, anemia is most likely secondary to heavy menstrual bleeding. -Recommend patient proceed with lab work-up today to determine underlying etiology. -Patient will also proceed with IV Feraheme 510 mg x 2 doses 1 week apart. -Patient will return to clinic in 2 months.  2.  Family history of cancer -Maternal aunt and maternal uncle-colon cancer -Maternal aunt-ovarian cancer  Addendum: -I have independently talked to the patient and examined her and agree with the above findings of Jaclyn Fila, FNP.  Patient has menorrhagia.  Denies any bleeding per rectum or melena.  She had a history of iron deficiency anemia.  Her labs in 2016 also showed coexisting vitamin B12 deficiency.  Currently taking iron tablet 3 times a day without major improvement.  Hence we have recommended parenteral iron therapy with Feraheme.  We discussed various side effects including anaphylactic reactions in detail.  She is agreeable to proceed with infusions.  We will see her back in couple of months to repeat labs to see the progress.  We have also mentioned about possible GYN evaluation for continuing menorrhagia.  She will talk to her PMD.  Orders Placed This Encounter  Procedures  . CBC with Differential    Standing Status:   Future    Number of Occurrences:   1    Standing Expiration Date:   05/30/2020  . Comprehensive metabolic panel    Standing Status:   Future    Number of Occurrences:   1    Standing Expiration Date:   05/30/2020  . Iron and TIBC    Standing Status:   Future    Number of Occurrences:   1    Standing Expiration Date:   05/30/2020  . Ferritin    Standing Status:   Future    Number of Occurrences:   1    Standing Expiration Date:   05/30/2020  . Vitamin B12    Standing Status:   Future    Number of Occurrences:   1    Standing Expiration Date:   05/30/2020  . Folate     Standing Status:   Future    Number of Occurrences:   1    Standing Expiration Date:   05/30/2020  . Methylmalonic acid, serum    Standing Status:   Future    Standing Expiration Date:   05/30/2020  . Lactate dehydrogenase    Standing Status:   Future    Standing Expiration Date:   05/30/2020  . Multiple Myeloma Panel (SPEP&IFE w/QIG)    Standing Status:   Future  Standing Expiration Date:   05/30/2020    All questions were answered. The patient knows to call the clinic with any problems, questions or concerns.  This note was electronically signed.    Derek Jack, MD  05/31/2019 4:43 PM

## 2019-05-31 NOTE — Assessment & Plan Note (Addendum)
1.  Normocytic anemia -Most likely secondary to heavy menstrual bleeding -Patient apparently developed anemia 3 years ago after childbirth. -Patient experiences heavy menstrual bleeding for 1-2 of 7-day cycle occurring every 28 days. -Hemoglobin in September 2019 was 10.6 with MCV of 91.5.  Most recent hemoglobin from October 2020 was 9.1 with MCV of 90.6. -Currently on p.o. iron 3 times daily. -Today, we discussed differential diagnosis of anemia including but not limited to mineral deficiency, acute blood loss, autoimmune disorder, chronic kidney disease, chronic inflammation, medications, or bone marrow process.  In her case, anemia is most likely secondary to heavy menstrual bleeding. -Recommend patient proceed with lab work-up today to determine underlying etiology. -Patient will also proceed with IV Feraheme 510 mg x 2 doses 1 week apart. -Patient will return to clinic in 2 months.  2.  Family history of cancer -Maternal aunt and maternal uncle-colon cancer -Maternal aunt-ovarian cancer

## 2019-06-05 DIAGNOSIS — D500 Iron deficiency anemia secondary to blood loss (chronic): Secondary | ICD-10-CM | POA: Insufficient documentation

## 2019-06-07 ENCOUNTER — Inpatient Hospital Stay (HOSPITAL_COMMUNITY): Payer: Medicaid Other

## 2019-06-07 ENCOUNTER — Other Ambulatory Visit: Payer: Self-pay

## 2019-06-07 VITALS — BP 126/88 | HR 81 | Temp 97.5°F | Resp 18

## 2019-06-07 DIAGNOSIS — D649 Anemia, unspecified: Secondary | ICD-10-CM

## 2019-06-07 DIAGNOSIS — D500 Iron deficiency anemia secondary to blood loss (chronic): Secondary | ICD-10-CM | POA: Diagnosis not present

## 2019-06-07 MED ORDER — FERUMOXYTOL INJECTION 510 MG/17 ML
510.0000 mg | Freq: Once | INTRAVENOUS | Status: AC
  Administered 2019-06-07: 510 mg via INTRAVENOUS
  Filled 2019-06-07: qty 17

## 2019-06-07 MED ORDER — SODIUM CHLORIDE 0.9 % IV SOLN
Freq: Once | INTRAVENOUS | Status: AC
  Administered 2019-06-07: 14:00:00 via INTRAVENOUS

## 2019-06-07 NOTE — Progress Notes (Signed)
Jaclyn Vaughn presents today for IV iron infusion. Infusion tolerated without incident or complaint. See MAR for details. VSS prior to and post infusion. Discharged in satisfactory condition with follow up instructions.

## 2019-06-07 NOTE — Patient Instructions (Signed)
[place] at [facility]  Discharge Instructions:  IV Feraheme received today. _______________________________________________________________  Thank you for choosing [place] at [facility] to provide your oncology and hematology care.  To afford each patient quality time with our providers, please arrive at least 15 minutes before your scheduled appointment.  You need to re-schedule your appointment if you arrive 10 or more minutes late.  We strive to give you quality time with our providers, and arriving late affects you and other patients whose appointments are after yours.  Also, if you no show three or more times for appointments you may be dismissed from the clinic.  Again, thank you for choosing [place] at [place] hope is that these requests will allow you access to exceptional care and in a timely manner. _______________________________________________________________  If you have questions after your visit, please contact our office at [phone] between the hours of 8:30 a.m. and 5:00 p.m. Voicemails left after 4:30 p.m. will not be returned until the following business day. _______________________________________________________________  For prescription refill requests, have your pharmacy contact our office. _______________________________________________________________  Recommendations made by the consultant and any test results will be sent to your referring physician. _______________________________________________________________

## 2019-06-14 ENCOUNTER — Encounter (HOSPITAL_COMMUNITY): Payer: Self-pay

## 2019-06-14 ENCOUNTER — Inpatient Hospital Stay (HOSPITAL_COMMUNITY): Payer: Medicaid Other | Attending: Hematology

## 2019-06-14 ENCOUNTER — Other Ambulatory Visit: Payer: Self-pay

## 2019-06-14 VITALS — BP 137/95 | HR 83 | Temp 97.5°F | Resp 18

## 2019-06-14 DIAGNOSIS — Z83438 Family history of other disorder of lipoprotein metabolism and other lipidemia: Secondary | ICD-10-CM | POA: Insufficient documentation

## 2019-06-14 DIAGNOSIS — Z833 Family history of diabetes mellitus: Secondary | ICD-10-CM | POA: Diagnosis not present

## 2019-06-14 DIAGNOSIS — Z8249 Family history of ischemic heart disease and other diseases of the circulatory system: Secondary | ICD-10-CM | POA: Insufficient documentation

## 2019-06-14 DIAGNOSIS — E669 Obesity, unspecified: Secondary | ICD-10-CM | POA: Diagnosis not present

## 2019-06-14 DIAGNOSIS — Z809 Family history of malignant neoplasm, unspecified: Secondary | ICD-10-CM | POA: Insufficient documentation

## 2019-06-14 DIAGNOSIS — R5383 Other fatigue: Secondary | ICD-10-CM | POA: Insufficient documentation

## 2019-06-14 DIAGNOSIS — N920 Excessive and frequent menstruation with regular cycle: Secondary | ICD-10-CM | POA: Insufficient documentation

## 2019-06-14 DIAGNOSIS — Z8379 Family history of other diseases of the digestive system: Secondary | ICD-10-CM | POA: Insufficient documentation

## 2019-06-14 DIAGNOSIS — Z836 Family history of other diseases of the respiratory system: Secondary | ICD-10-CM | POA: Diagnosis not present

## 2019-06-14 DIAGNOSIS — D500 Iron deficiency anemia secondary to blood loss (chronic): Secondary | ICD-10-CM | POA: Diagnosis present

## 2019-06-14 DIAGNOSIS — Z79899 Other long term (current) drug therapy: Secondary | ICD-10-CM | POA: Insufficient documentation

## 2019-06-14 DIAGNOSIS — D649 Anemia, unspecified: Secondary | ICD-10-CM

## 2019-06-14 MED ORDER — SODIUM CHLORIDE 0.9 % IV SOLN
Freq: Once | INTRAVENOUS | Status: AC
  Administered 2019-06-14: 14:00:00 via INTRAVENOUS

## 2019-06-14 MED ORDER — SODIUM CHLORIDE 0.9 % IV SOLN
510.0000 mg | Freq: Once | INTRAVENOUS | Status: AC
  Administered 2019-06-14: 510 mg via INTRAVENOUS
  Filled 2019-06-14: qty 510

## 2019-06-14 NOTE — Progress Notes (Signed)
Jaclyn Vaughn tolerated Feraheme infusion well without complaints or incident. Peripheral IV site checked with positive blood return noted prior to and after infusion. VSS upon discharge. Pt discharged self ambulatory in satisfactory condition

## 2019-06-14 NOTE — Patient Instructions (Signed)
[facility] at [facility] Discharge Instructions  Received Feraheme infusion today. Follow-up as scheduled. Call clinic for any questions or concerns   Thank you for choosing [facility] at [facility] to provide your oncology and hematology care.  To afford each patient quality time with our provider, please arrive at least 15 minutes before your scheduled appointment time.   If you have a lab appointment with the Cancer Center please come in thru the Main Entrance and check in at the main information desk.  You need to re-schedule your appointment should you arrive 10 or more minutes late.  We strive to give you quality time with our providers, and arriving late affects you and other patients whose appointments are after yours.  Also, if you no show three or more times for appointments you may be dismissed from the clinic at the providers discretion.     Again, thank you for choosing [facility].  Our hope is that these requests will decrease the amount of time that you wait before being seen by our physicians.       _____________________________________________________________  Should you have questions after your visit to [facility], please contact our office at [phone] between the hours of 8:00 a.m. and 4:30 p.m.  Voicemails left after 4:00 p.m. will not be returned until the following business day.  For prescription refill requests, have your pharmacy contact our office and allow 72 hours.    Due to Covid, you will need to wear a mask upon entering the hospital. If you do not have a mask, a mask will be given to you at the Main Entrance upon arrival. For doctor visits, patients may have 1 support person with them. For treatment visits, patients can not have anyone with them due to social distancing guidelines and our immunocompromised population.     

## 2019-08-04 ENCOUNTER — Other Ambulatory Visit (HOSPITAL_COMMUNITY): Payer: Self-pay | Admitting: *Deleted

## 2019-08-04 DIAGNOSIS — D649 Anemia, unspecified: Secondary | ICD-10-CM

## 2019-08-04 DIAGNOSIS — D500 Iron deficiency anemia secondary to blood loss (chronic): Secondary | ICD-10-CM

## 2019-08-05 ENCOUNTER — Inpatient Hospital Stay (HOSPITAL_COMMUNITY): Payer: Medicaid Other | Attending: Hematology

## 2019-08-05 ENCOUNTER — Other Ambulatory Visit: Payer: Self-pay

## 2019-08-05 DIAGNOSIS — D500 Iron deficiency anemia secondary to blood loss (chronic): Secondary | ICD-10-CM

## 2019-08-05 DIAGNOSIS — D649 Anemia, unspecified: Secondary | ICD-10-CM

## 2019-08-05 LAB — CBC WITH DIFFERENTIAL/PLATELET
Abs Immature Granulocytes: 0.01 10*3/uL (ref 0.00–0.07)
Basophils Absolute: 0 10*3/uL (ref 0.0–0.1)
Basophils Relative: 1 %
Eosinophils Absolute: 0.4 10*3/uL (ref 0.0–0.5)
Eosinophils Relative: 15 %
HCT: 39 % (ref 36.0–46.0)
Hemoglobin: 12.3 g/dL (ref 12.0–15.0)
Immature Granulocytes: 0 %
Lymphocytes Relative: 35 %
Lymphs Abs: 1 10*3/uL (ref 0.7–4.0)
MCH: 29.8 pg (ref 26.0–34.0)
MCHC: 31.5 g/dL (ref 30.0–36.0)
MCV: 94.4 fL (ref 80.0–100.0)
Monocytes Absolute: 0.2 10*3/uL (ref 0.1–1.0)
Monocytes Relative: 8 %
Neutro Abs: 1.2 10*3/uL — ABNORMAL LOW (ref 1.7–7.7)
Neutrophils Relative %: 41 %
Platelets: 255 10*3/uL (ref 150–400)
RBC: 4.13 MIL/uL (ref 3.87–5.11)
RDW: 17.2 % — ABNORMAL HIGH (ref 11.5–15.5)
WBC: 2.9 10*3/uL — ABNORMAL LOW (ref 4.0–10.5)
nRBC: 0 % (ref 0.0–0.2)

## 2019-08-05 LAB — LACTATE DEHYDROGENASE: LDH: 111 U/L (ref 98–192)

## 2019-08-09 ENCOUNTER — Inpatient Hospital Stay (HOSPITAL_BASED_OUTPATIENT_CLINIC_OR_DEPARTMENT_OTHER): Payer: Medicaid Other | Admitting: Hematology

## 2019-08-09 ENCOUNTER — Other Ambulatory Visit: Payer: Self-pay

## 2019-08-09 ENCOUNTER — Encounter (HOSPITAL_COMMUNITY): Payer: Self-pay | Admitting: Hematology

## 2019-08-09 DIAGNOSIS — D500 Iron deficiency anemia secondary to blood loss (chronic): Secondary | ICD-10-CM | POA: Diagnosis not present

## 2019-08-09 DIAGNOSIS — D649 Anemia, unspecified: Secondary | ICD-10-CM | POA: Diagnosis not present

## 2019-08-09 LAB — MULTIPLE MYELOMA PANEL, SERUM
Albumin SerPl Elph-Mcnc: 3.9 g/dL (ref 2.9–4.4)
Albumin/Glob SerPl: 1.7 (ref 0.7–1.7)
Alpha 1: 0.2 g/dL (ref 0.0–0.4)
Alpha2 Glob SerPl Elph-Mcnc: 0.6 g/dL (ref 0.4–1.0)
B-Globulin SerPl Elph-Mcnc: 0.9 g/dL (ref 0.7–1.3)
Gamma Glob SerPl Elph-Mcnc: 0.7 g/dL (ref 0.4–1.8)
Globulin, Total: 2.4 g/dL (ref 2.2–3.9)
IgA: 162 mg/dL (ref 87–352)
IgG (Immunoglobin G), Serum: 876 mg/dL (ref 586–1602)
IgM (Immunoglobulin M), Srm: 58 mg/dL (ref 26–217)
Total Protein ELP: 6.3 g/dL (ref 6.0–8.5)

## 2019-08-09 NOTE — Progress Notes (Signed)
Virtual Visit via Telephone Note  I connected with Jaclyn Vaughn on 08/09/19 at  2:20 PM EST by telephone and verified that I am speaking with the correct person using two identifiers.   I discussed the limitations, risks, security and privacy concerns of performing an evaluation and management service by telephone and the availability of in person appointments. I also discussed with the patient that there may be a patient responsible charge related to this service. The patient expressed understanding and agreed to proceed.   History of Present Illness: She was evaluated in our clinic for severe anemia.  She has menorrhagia and blood loss and has history of iron deficiency anemia.  Labs in 2016 also showed vitamin B12 deficiency.  She was taking iron tablet 3 times a day.   Observations/Objective: She has received Feraheme on 05/28/2019 and 06/14/2019.  She has felt much improvement in her energy level since then.  She is continuing to have increased menstrual blood loss.  She does report some lightheadedness when she bends forward.  Appetite is 100%.  Energy levels are 75%.  Assessment and Plan:  1.  Normocytic anemia from iron deficiency: -She received Feraheme on 06/07/2019 and 06/14/2019. -We reviewed blood work from 08/04/2018.  Hemoglobin improved to 12.3.  This was 9.5 previously.  SPEP is negative. -I have counseled her to see GYN doctor for better control of her menstrual bleeding. -I will see her back in 2 months with repeat labs including ferritin and iron panel.  2.  B12 deficiency: -Her B12 level was low at 68. -I have recommended her to have her B12 shot 1 mg in our clinic.  She was also told to start taking B12 1 mg tablet daily. -I plan to recheck her labs in 2 months.   Follow Up Instructions:   RTC 2 months with labs. I discussed the assessment and treatment plan with the patient. The patient was provided an opportunity to ask questions and all were answered. The  patient agreed with the plan and demonstrated an understanding of the instructions.   The patient was advised to call back or seek an in-person evaluation if the symptoms worsen or if the condition fails to improve as anticipated.  I provided 11 minutes of non-face-to-face time during this encounter.   Doreatha Massed, MD

## 2019-08-12 LAB — METHYLMALONIC ACID, SERUM: Methylmalonic Acid, Quantitative: 341 nmol/L (ref 0–378)

## 2019-08-15 ENCOUNTER — Ambulatory Visit (HOSPITAL_COMMUNITY): Payer: Medicaid Other

## 2019-10-18 ENCOUNTER — Inpatient Hospital Stay (HOSPITAL_COMMUNITY): Payer: Medicaid Other

## 2019-10-19 ENCOUNTER — Inpatient Hospital Stay (HOSPITAL_COMMUNITY): Payer: Medicaid Other | Attending: Hematology

## 2019-10-19 DIAGNOSIS — Z79899 Other long term (current) drug therapy: Secondary | ICD-10-CM | POA: Insufficient documentation

## 2019-10-19 DIAGNOSIS — D500 Iron deficiency anemia secondary to blood loss (chronic): Secondary | ICD-10-CM | POA: Insufficient documentation

## 2019-10-19 DIAGNOSIS — Z836 Family history of other diseases of the respiratory system: Secondary | ICD-10-CM | POA: Diagnosis not present

## 2019-10-19 DIAGNOSIS — E538 Deficiency of other specified B group vitamins: Secondary | ICD-10-CM | POA: Diagnosis not present

## 2019-10-19 DIAGNOSIS — D649 Anemia, unspecified: Secondary | ICD-10-CM

## 2019-10-19 DIAGNOSIS — N920 Excessive and frequent menstruation with regular cycle: Secondary | ICD-10-CM | POA: Insufficient documentation

## 2019-10-19 DIAGNOSIS — K56609 Unspecified intestinal obstruction, unspecified as to partial versus complete obstruction: Secondary | ICD-10-CM | POA: Insufficient documentation

## 2019-10-19 DIAGNOSIS — Z8379 Family history of other diseases of the digestive system: Secondary | ICD-10-CM | POA: Insufficient documentation

## 2019-10-19 DIAGNOSIS — Z596 Low income: Secondary | ICD-10-CM | POA: Diagnosis not present

## 2019-10-19 DIAGNOSIS — Z83438 Family history of other disorder of lipoprotein metabolism and other lipidemia: Secondary | ICD-10-CM | POA: Diagnosis not present

## 2019-10-19 DIAGNOSIS — Z809 Family history of malignant neoplasm, unspecified: Secondary | ICD-10-CM | POA: Insufficient documentation

## 2019-10-19 DIAGNOSIS — Z9884 Bariatric surgery status: Secondary | ICD-10-CM | POA: Insufficient documentation

## 2019-10-19 DIAGNOSIS — Z833 Family history of diabetes mellitus: Secondary | ICD-10-CM | POA: Diagnosis not present

## 2019-10-19 DIAGNOSIS — Z8249 Family history of ischemic heart disease and other diseases of the circulatory system: Secondary | ICD-10-CM | POA: Diagnosis not present

## 2019-10-19 LAB — CBC WITH DIFFERENTIAL/PLATELET
Abs Immature Granulocytes: 0.01 10*3/uL (ref 0.00–0.07)
Basophils Absolute: 0 10*3/uL (ref 0.0–0.1)
Basophils Relative: 0 %
Eosinophils Absolute: 0.1 10*3/uL (ref 0.0–0.5)
Eosinophils Relative: 2 %
HCT: 34.5 % — ABNORMAL LOW (ref 36.0–46.0)
Hemoglobin: 11 g/dL — ABNORMAL LOW (ref 12.0–15.0)
Immature Granulocytes: 0 %
Lymphocytes Relative: 29 %
Lymphs Abs: 1.2 10*3/uL (ref 0.7–4.0)
MCH: 31.4 pg (ref 26.0–34.0)
MCHC: 31.9 g/dL (ref 30.0–36.0)
MCV: 98.6 fL (ref 80.0–100.0)
Monocytes Absolute: 0.3 10*3/uL (ref 0.1–1.0)
Monocytes Relative: 8 %
Neutro Abs: 2.4 10*3/uL (ref 1.7–7.7)
Neutrophils Relative %: 61 %
Platelets: 339 10*3/uL (ref 150–400)
RBC: 3.5 MIL/uL — ABNORMAL LOW (ref 3.87–5.11)
RDW: 12.7 % (ref 11.5–15.5)
WBC: 4 10*3/uL (ref 4.0–10.5)
nRBC: 0 % (ref 0.0–0.2)

## 2019-10-19 LAB — COMPREHENSIVE METABOLIC PANEL
ALT: 16 U/L (ref 0–44)
AST: 13 U/L — ABNORMAL LOW (ref 15–41)
Albumin: 3.2 g/dL — ABNORMAL LOW (ref 3.5–5.0)
Alkaline Phosphatase: 36 U/L — ABNORMAL LOW (ref 38–126)
Anion gap: 9 (ref 5–15)
BUN: 11 mg/dL (ref 6–20)
CO2: 25 mmol/L (ref 22–32)
Calcium: 8.6 mg/dL — ABNORMAL LOW (ref 8.9–10.3)
Chloride: 107 mmol/L (ref 98–111)
Creatinine, Ser: 0.66 mg/dL (ref 0.44–1.00)
GFR calc Af Amer: >60 mL/min (ref >60)
GFR calc non Af Amer: >60 mL/min (ref >60)
Glucose, Bld: 111 mg/dL — ABNORMAL HIGH (ref 70–99)
Potassium: 3.7 mmol/L (ref 3.5–5.1)
Sodium: 141 mmol/L (ref 135–145)
Total Bilirubin: 0.5 mg/dL (ref 0.3–1.2)
Total Protein: 5.9 g/dL — ABNORMAL LOW (ref 6.5–8.1)

## 2019-10-25 ENCOUNTER — Other Ambulatory Visit: Payer: Self-pay

## 2019-10-25 ENCOUNTER — Inpatient Hospital Stay (HOSPITAL_COMMUNITY): Payer: Medicaid Other

## 2019-10-25 ENCOUNTER — Inpatient Hospital Stay (HOSPITAL_BASED_OUTPATIENT_CLINIC_OR_DEPARTMENT_OTHER): Payer: Medicaid Other | Admitting: Hematology

## 2019-10-25 VITALS — BP 133/95 | HR 83 | Temp 97.1°F | Resp 18 | Wt 195.0 lb

## 2019-10-25 DIAGNOSIS — D500 Iron deficiency anemia secondary to blood loss (chronic): Secondary | ICD-10-CM

## 2019-10-25 LAB — FERRITIN: Ferritin: 21 ng/mL (ref 11–307)

## 2019-10-25 LAB — VITAMIN B12: Vitamin B-12: 132 pg/mL — ABNORMAL LOW (ref 180–914)

## 2019-10-25 LAB — IRON AND TIBC
Iron: 34 ug/dL (ref 28–170)
Saturation Ratios: 12 % (ref 10.4–31.8)
TIBC: 281 ug/dL (ref 250–450)
UIBC: 247 ug/dL

## 2019-10-25 NOTE — Progress Notes (Signed)
[facility] [address], [place] [address]   CLINIC:  Medical Oncology/Hematology  PCP:  Alisia Ferrari, NP [address] [place] [address] [phone]   REASON FOR VISIT:  Follow-up for iron deficiency and B12 deficiency.  CURRENT THERAPY: Intermittent Feraheme and B12 1 mg tablet daily.    INTERVAL HISTORY:  Jaclyn Vaughn 39 y.o. female seen for follow-up of iron deficiency anemia.  He has a history of gastric bypass surgery.  Her last Duncan Dull was in December 2020.  She is having heavy menses.  Last menstrual period was in the last week of March.  She was apparently admitted to [facility] with small bowel obstruction and had to undergo resection.  She is taking B12 1 mg tablet daily.  Denies any urine per rectum or melena.  Appetite and energy levels are 75%.    REVIEW OF SYSTEMS:  Review of Systems  All other systems reviewed and are negative.    PAST MEDICAL/SURGICAL HISTORY:  Past Medical History:  Diagnosis Date  . Anemia   . Anxiety   . Depression   . Diabetes mellitus without complication (HCC)   . Heart murmur   . History of vitamin D deficiency   . Hyperlipidemia   . Hypertension    Past Surgical History:  Procedure Laterality Date  . APPENDECTOMY    . CESAREAN SECTION  02/2005  . GASTRIC BYPASS    . HERNIA REPAIR    . lap wound exploration    . TUBAL LIGATION       SOCIAL HISTORY:  Social History   Socioeconomic History  . Marital status: Single    Spouse name: Not on file  . Number of children: 3  . Years of education: Not on file  . Highest education level: Not on file  Occupational History  . Occupation: Employed  Tobacco Use  . Smoking status: Never Smoker  . Smokeless tobacco: Never Used  Substance and Sexual Activity  . Alcohol use: No  . Drug use: No  . Sexual activity: Yes    Birth control/protection: Condom  Other Topics Concern  . Not on file  Social History Narrative  . Not on  file   Social Determinants of Health   Financial Resource Strain:   . Difficulty of Paying Living Expenses:   Food Insecurity:   . Worried About Programme researcher, broadcasting/film/video in the Last Year:   . Barista in the Last Year:   Transportation Needs:   . Freight forwarder (Medical):   Marland Kitchen Lack of Transportation (Non-Medical):   Physical Activity:   . Days of Exercise per Week:   . Minutes of Exercise per Session:   Stress:   . Feeling of Stress :   Social Connections:   . Frequency of Communication with Friends and Family:   . Frequency of Social Gatherings with Friends and Family:   . Attends Religious Services:   . Active Member of Clubs or Organizations:   . Attends Banker Meetings:   Marland Kitchen Marital Status:   Intimate Partner Violence:   . Fear of Current or Ex-Partner:   . Emotionally Abused:   Marland Kitchen Physically Abused:   . Sexually Abused:     FAMILY HISTORY:  Family History  Problem Relation Age of Onset  . Diabetes Mother   . Hypertension Mother   . COPD Mother   . Hypertension Father   . Hyperlipidemia Father   .  Healthy Daughter   . Healthy Son   . Obesity Sister   . Heart disease Brother   . Liver disease Brother   . Hypertension Maternal Grandmother   . Diabetes Maternal Grandmother   . Diverticulitis Maternal Grandmother   . Heart disease Maternal Grandfather   . Diabetes Paternal Grandmother   . Hypertension Paternal Grandmother   . Cancer Paternal Grandfather   . Obesity Sister   . Healthy Daughter     CURRENT MEDICATIONS:  Outpatient Encounter Medications as of 10/25/2019  Medication Sig  . buPROPion (WELLBUTRIN XL) 300 MG 24 hr tablet Take 300 mg by mouth daily.  . cyanocobalamin 1000 MCG tablet Take 1,000 mcg by mouth daily.   . ferrous sulfate 325 (65 FE) MG tablet Take 325 mg by mouth 3 (three) times daily with meals.  . gabapentin (NEURONTIN) 300 MG capsule Take by mouth.  Marland Kitchen lisinopril (ZESTRIL) 20 MG tablet Take 20 mg by mouth daily.   . metFORMIN (GLUCOPHAGE) 500 MG tablet Take 1 tablet (500 mg total) by mouth 2 (two) times daily with a meal.  . Multiple Vitamin (MULTIVITAMIN) capsule Take 1 capsule by mouth daily.  . Vitamin D, Ergocalciferol, (DRISDOL) 50000 units CAPS capsule Take 50,000 Units by mouth 2 (two) times a week.  Marland Kitchen acetaminophen (TYLENOL) 325 MG tablet Take 650 mg by mouth every 6 (six) hours as needed for moderate pain.  Marland Kitchen atorvastatin (LIPITOR) 10 MG tablet Take 10 mg by mouth daily.   No facility-administered encounter medications on file as of 10/25/2019.    ALLERGIES:  No Known Allergies   PHYSICAL EXAM:  ECOG Performance status: 0  Vitals:   10/25/19 1519  BP: (!) 133/95  Pulse: 83  Resp: 18  Temp: (!) 97.1 F (36.2 C)  SpO2: 100%   Filed Weights   10/25/19 1519  Weight: 195 lb (88.5 kg)    Physical Exam Vitals reviewed.  Constitutional:      Appearance: Normal appearance.  Cardiovascular:     Rate and Rhythm: Normal rate and regular rhythm.     Heart sounds: Normal heart sounds.  Pulmonary:     Effort: Pulmonary effort is normal.     Breath sounds: Normal breath sounds.  Neurological:     General: No focal deficit present.     Mental Status: She is alert and oriented to person, place, and time.  Psychiatric:        Mood and Affect: Mood normal.        Behavior: Behavior normal.      LABORATORY DATA:  I have reviewed the labs as listed.  CBC    Component Value Date/Time   WBC 4.0 10/19/2019 1117   RBC 3.50 (L) 10/19/2019 1117   HGB 11.0 (L) 10/19/2019 1117   HCT 34.5 (L) 10/19/2019 1117   PLT 339 10/19/2019 1117   MCV 98.6 10/19/2019 1117   MCH 31.4 10/19/2019 1117   MCHC 31.9 10/19/2019 1117   RDW 12.7 10/19/2019 1117   LYMPHSABS 1.2 10/19/2019 1117   MONOABS 0.3 10/19/2019 1117   EOSABS 0.1 10/19/2019 1117   BASOSABS 0.0 10/19/2019 1117   CMP Latest Ref Rng & Units 10/19/2019 05/31/2019 02/23/2015  Glucose 70 - 99 mg/dL 111(H) 105(H) 72  BUN 6 - 20 mg/dL 11  10 6   Creatinine 0.44 - 1.00 mg/dL 0.66 0.73 0.46  Sodium 135 - 145 mmol/L 141 135 134(L)  Potassium 3.5 - 5.1 mmol/L 3.7 4.1 4.6  Chloride 98 - 111 mmol/L  107 103 100(L)  CO2 22 - 32 mmol/L 25 24 24   Calcium 8.9 - 10.3 mg/dL ) 0.9(O) 9.3  Total Protein 6.5 - 8.1 g/dL 5.9(L) 7.1 6.8  Total Bilirubin 0.3 - 1.2 mg/dL 0.5 7.0(J) 0.4  Alkaline Phos 38 - 126 U/L 36(L) 51 39  AST 15 - 41 U/L 13(L) 19 18  ALT 0 - 44 U/L 16 13 12(L)       DIAGNOSTIC IMAGING:  I have independently reviewed the scans and discussed with the patient.     ASSESSMENT & PLAN:   Iron deficiency anemia due to chronic blood loss 1.  Normocytic anemia from iron deficiency: -Last Feraheme infusion on 06/07/2019 and 06/14/2019. -She had a history of gastric bypass surgery, potentially causing malabsorption.  She also has heavy menses.  LMP was in the last week of April. -Reportedly was at [facility] for small bowel obstruction and had resection done. -She does not feel excessively tired at this time. -We reviewed her CBC which showed hemoglobin 11.0.  Ferritin and iron panel were not done. -We will check her ferritin, iron panel today.  Based on those levels, will decide if she needs any iron infusion.  Otherwise I will see her back in 3 months with repeat labs.  2.  B12 deficiency: -Her prior B12 level was low at 68 on 05/31/2019. -She apparently received B12 injection at [facility].  She is taking B12 1 mg tablet daily. -We will check her B12 level today.  We will also repeat in 3 months.      Orders placed this encounter:  Orders Placed This Encounter  Procedures  . CBC with Differential/Platelet  . Comprehensive metabolic panel  . Iron and TIBC  . Ferritin  . Vitamin B12  . Folate      [facility], MD [facility] [phone]

## 2019-10-25 NOTE — Assessment & Plan Note (Signed)
1.  Normocytic anemia from iron deficiency: -Last Feraheme infusion on 06/07/2019 and 06/14/2019. -She had a history of gastric bypass surgery, potentially causing malabsorption.  She also has heavy menses.  LMP was in the last week of April. -Reportedly was at [facility] for small bowel obstruction and had resection done. -She does not feel excessively tired at this time. -We reviewed her CBC which showed hemoglobin 11.0.  Ferritin and iron panel were not done. -We will check her ferritin, iron panel today.  Based on those levels, will decide if she needs any iron infusion.  Otherwise I will see her back in 3 months with repeat labs.  2.  B12 deficiency: -Her prior B12 level was low at 68 on 05/31/2019. -She apparently received B12 injection at [facility].  She is taking B12 1 mg tablet daily. -We will check her B12 level today.  We will also repeat in 3 months.

## 2019-10-25 NOTE — Patient Instructions (Addendum)
[facility] at [facility] Discharge Instructions  You were seen today by Dr. Katragadda. He went over your recent lab results. He will see you back in 3 months for labs and follow up.   Thank you for choosing [facility] at [facility] to provide your oncology and hematology care.  To afford each patient quality time with our provider, please arrive at least 15 minutes before your scheduled appointment time.   If you have a lab appointment with the Cancer Center please come in thru the  Main Entrance and check in at the main information desk  You need to re-schedule your appointment should you arrive 10 or more minutes late.  We strive to give you quality time with our providers, and arriving late affects you and other patients whose appointments are after yours.  Also, if you no show three or more times for appointments you may be dismissed from the clinic at the providers discretion.     Again, thank you for choosing [facility].  Our hope is that these requests will decrease the amount of time that you wait before being seen by our physicians.       _____________________________________________________________  Should you have questions after your visit to [facility], please contact our office at [phone] between the hours of 8:00 a.m. and 4:30 p.m.  Voicemails left after 4:00 p.m. will not be returned until the following business day.  For prescription refill requests, have your pharmacy contact our office and allow 72 hours.    Cancer Center Support Programs:   > Cancer Support Group  2nd Tuesday of the month 1pm-2pm, Journey Room    

## 2020-01-18 ENCOUNTER — Other Ambulatory Visit: Payer: Self-pay

## 2020-01-18 ENCOUNTER — Inpatient Hospital Stay (HOSPITAL_COMMUNITY): Payer: Medicaid Other | Attending: Hematology

## 2020-01-18 DIAGNOSIS — Z8249 Family history of ischemic heart disease and other diseases of the circulatory system: Secondary | ICD-10-CM | POA: Diagnosis not present

## 2020-01-18 DIAGNOSIS — E669 Obesity, unspecified: Secondary | ICD-10-CM | POA: Insufficient documentation

## 2020-01-18 DIAGNOSIS — Z8349 Family history of other endocrine, nutritional and metabolic diseases: Secondary | ICD-10-CM | POA: Diagnosis not present

## 2020-01-18 DIAGNOSIS — E538 Deficiency of other specified B group vitamins: Secondary | ICD-10-CM | POA: Insufficient documentation

## 2020-01-18 DIAGNOSIS — Z79899 Other long term (current) drug therapy: Secondary | ICD-10-CM | POA: Insufficient documentation

## 2020-01-18 DIAGNOSIS — K909 Intestinal malabsorption, unspecified: Secondary | ICD-10-CM | POA: Diagnosis not present

## 2020-01-18 DIAGNOSIS — Z83438 Family history of other disorder of lipoprotein metabolism and other lipidemia: Secondary | ICD-10-CM | POA: Insufficient documentation

## 2020-01-18 DIAGNOSIS — Z9049 Acquired absence of other specified parts of digestive tract: Secondary | ICD-10-CM | POA: Insufficient documentation

## 2020-01-18 DIAGNOSIS — D500 Iron deficiency anemia secondary to blood loss (chronic): Secondary | ICD-10-CM

## 2020-01-18 DIAGNOSIS — Z8379 Family history of other diseases of the digestive system: Secondary | ICD-10-CM | POA: Diagnosis not present

## 2020-01-18 DIAGNOSIS — Z9884 Bariatric surgery status: Secondary | ICD-10-CM | POA: Diagnosis not present

## 2020-01-18 DIAGNOSIS — Z833 Family history of diabetes mellitus: Secondary | ICD-10-CM | POA: Diagnosis not present

## 2020-01-18 DIAGNOSIS — Z836 Family history of other diseases of the respiratory system: Secondary | ICD-10-CM | POA: Diagnosis not present

## 2020-01-18 DIAGNOSIS — D508 Other iron deficiency anemias: Secondary | ICD-10-CM | POA: Insufficient documentation

## 2020-01-18 DIAGNOSIS — Z809 Family history of malignant neoplasm, unspecified: Secondary | ICD-10-CM | POA: Diagnosis not present

## 2020-01-18 LAB — CBC WITH DIFFERENTIAL/PLATELET
Abs Immature Granulocytes: 0.01 10*3/uL (ref 0.00–0.07)
Basophils Absolute: 0 10*3/uL (ref 0.0–0.1)
Basophils Relative: 1 %
Eosinophils Absolute: 0.2 10*3/uL (ref 0.0–0.5)
Eosinophils Relative: 4 %
HCT: 35.4 % — ABNORMAL LOW (ref 36.0–46.0)
Hemoglobin: 11.2 g/dL — ABNORMAL LOW (ref 12.0–15.0)
Immature Granulocytes: 0 %
Lymphocytes Relative: 32 %
Lymphs Abs: 1.4 10*3/uL (ref 0.7–4.0)
MCH: 29.9 pg (ref 26.0–34.0)
MCHC: 31.6 g/dL (ref 30.0–36.0)
MCV: 94.4 fL (ref 80.0–100.0)
Monocytes Absolute: 0.3 10*3/uL (ref 0.1–1.0)
Monocytes Relative: 7 %
Neutro Abs: 2.5 10*3/uL (ref 1.7–7.7)
Neutrophils Relative %: 56 %
Platelets: 307 10*3/uL (ref 150–400)
RBC: 3.75 MIL/uL — ABNORMAL LOW (ref 3.87–5.11)
RDW: 13.6 % (ref 11.5–15.5)
WBC: 4.4 10*3/uL (ref 4.0–10.5)
nRBC: 0 % (ref 0.0–0.2)

## 2020-01-18 LAB — COMPREHENSIVE METABOLIC PANEL
ALT: 23 U/L (ref 0–44)
AST: 23 U/L (ref 15–41)
Albumin: 3.6 g/dL (ref 3.5–5.0)
Alkaline Phosphatase: 46 U/L (ref 38–126)
Anion gap: 8 (ref 5–15)
BUN: 13 mg/dL (ref 6–20)
CO2: 24 mmol/L (ref 22–32)
Calcium: 8.4 mg/dL — ABNORMAL LOW (ref 8.9–10.3)
Chloride: 105 mmol/L (ref 98–111)
Creatinine, Ser: 0.73 mg/dL (ref 0.44–1.00)
GFR calc Af Amer: >60 mL/min (ref >60)
GFR calc non Af Amer: >60 mL/min (ref >60)
Glucose, Bld: 99 mg/dL (ref 70–99)
Potassium: 3.9 mmol/L (ref 3.5–5.1)
Sodium: 137 mmol/L (ref 135–145)
Total Bilirubin: 0.3 mg/dL (ref 0.3–1.2)
Total Protein: 6.4 g/dL — ABNORMAL LOW (ref 6.5–8.1)

## 2020-01-18 LAB — VITAMIN B12: Vitamin B-12: 88 pg/mL — ABNORMAL LOW (ref 180–914)

## 2020-01-18 LAB — FOLATE: Folate: 9.2 ng/mL (ref >5.9)

## 2020-01-18 LAB — IRON AND TIBC
Iron: 52 ug/dL (ref 28–170)
Saturation Ratios: 15 % (ref 10.4–31.8)
TIBC: 342 ug/dL (ref 250–450)
UIBC: 290 ug/dL

## 2020-01-18 LAB — FERRITIN: Ferritin: 12 ng/mL (ref 11–307)

## 2020-01-25 ENCOUNTER — Other Ambulatory Visit: Payer: Self-pay

## 2020-01-25 ENCOUNTER — Inpatient Hospital Stay (HOSPITAL_BASED_OUTPATIENT_CLINIC_OR_DEPARTMENT_OTHER): Payer: Medicaid Other | Admitting: Hematology

## 2020-01-25 VITALS — BP 119/87 | HR 94 | Temp 97.5°F | Resp 18 | Wt 184.9 lb

## 2020-01-25 DIAGNOSIS — D508 Other iron deficiency anemias: Secondary | ICD-10-CM | POA: Diagnosis not present

## 2020-01-25 DIAGNOSIS — D500 Iron deficiency anemia secondary to blood loss (chronic): Secondary | ICD-10-CM

## 2020-01-25 MED ORDER — CYANOCOBALAMIN 1000 MCG/ML IJ SOLN
1000.0000 ug | Freq: Once | INTRAMUSCULAR | Status: AC
  Administered 2020-01-25: 1000 ug via INTRAMUSCULAR
  Filled 2020-01-25: qty 1

## 2020-01-25 MED ORDER — "SYRINGE 23G X 1"" 3 ML MISC": 1.0000 [IU] | 6 refills | Status: AC

## 2020-01-25 MED ORDER — CYANOCOBALAMIN 1000 MCG/ML IJ SOLN: 1000.0000 ug | Freq: Once | INTRAMUSCULAR | 6 refills | Status: AC

## 2020-01-25 NOTE — Patient Instructions (Signed)
[facility] at [facility] Discharge Instructions  You were seen today by Dr. Ellin Saba. He went over your recent results. You will be prescribed vitamin B12 injections to inject once a week for 4 weeks, then inject just once a month afterwards. You will receive 2 iron infusions with 1 week in between. Dr. Ellin Saba will see you back in 4 months for labs and follow up.   Thank you for choosing [facility] at [facility] to provide your oncology and hematology care.  To afford each patient quality time with our provider, please arrive at least 15 minutes before your scheduled appointment time.   If you have a lab appointment with the Cancer Center please come in thru the Main Entrance and check in at the main information desk  You need to re-schedule your appointment should you arrive 10 or more minutes late.  We strive to give you quality time with our providers, and arriving late affects you and other patients whose appointments are after yours.  Also, if you no show three or more times for appointments you may be dismissed from the clinic at the providers discretion.     Again, thank you for choosing [facility].  Our hope is that these requests will decrease the amount of time that you wait before being seen by our physicians.       _____________________________________________________________  Should you have questions after your visit to [facility], please contact our office at [phone] between the hours of 8:00 a.m. and 4:30 p.m.  Voicemails left after 4:00 p.m. will not be returned until the following business day.  For prescription refill requests, have your pharmacy contact our office and allow 72 hours.    Cancer Center Support Programs:   > Cancer Support Group  2nd Tuesday of the month 1pm-2pm, Journey Room

## 2020-01-25 NOTE — Progress Notes (Signed)
[facility] [address] [place], [place] [address]   CLINIC:  Medical Oncology/Hematology  PCP:  Alisia Ferrari, NP [address] [place] [address]  [phone]  REASON FOR VISIT:  Follow-up for iron deficiency anemia and B12 deficiency  PRIOR THERAPY: None  CURRENT THERAPY: Intermittent Feraheme and B12 1 mg tablet daily  INTERVAL HISTORY:  [name], a 39 y.o. female, returns for routine follow-up for her iron deficiency anemia and B12 deficiency. Veralyn was last seen on 10/25/2019.  Today she reports feeling well, and denies having any numbness or tingling in fingers or toes. She continues taking her vitamin B12 daily and iron tablets daily.   REVIEW OF SYSTEMS:  Review of Systems  Constitutional: Negative for appetite change and fatigue.  Neurological: Negative for numbness.  Hematological: Negative for adenopathy.  All other systems reviewed and are negative.   PAST MEDICAL/SURGICAL HISTORY:  Past Medical History:  Diagnosis Date   Anemia    Anxiety    Depression    Diabetes mellitus without complication (HCC)    Heart murmur    History of vitamin D deficiency    Hyperlipidemia    Hypertension    Past Surgical History:  Procedure Laterality Date   APPENDECTOMY     CESAREAN SECTION  02/2005   GASTRIC BYPASS     HERNIA REPAIR     lap wound exploration     TUBAL LIGATION      SOCIAL HISTORY:  Social History   Socioeconomic History   Marital status: Single    Spouse name: Not on file   Number of children: 3   Years of education: Not on file   Highest education level: Not on file  Occupational History   Occupation: Employed  Tobacco Use   Smoking status: Never Smoker   Smokeless tobacco: Never Used  Building services engineer Use: Never used  Substance and Sexual Activity   Alcohol use: No   Drug use: No   Sexual activity: Yes    Birth control/protection: Condom  Other Topics Concern     Not on file  Social History Narrative   Not on file   Social Determinants of Health   Financial Resource Strain:    Difficulty of Paying Living Expenses:   Food Insecurity:    Worried About Programme researcher, broadcasting/film/video in the Last Year:    Barista in the Last Year:   Transportation Needs:    Freight forwarder (Medical):    Lack of Transportation (Non-Medical):   Physical Activity:    Days of Exercise per Week:    Minutes of Exercise per Session:   Stress:    Feeling of Stress :   Social Connections:    Frequency of Communication with Friends and Family:    Frequency of Social Gatherings with Friends and Family:    Attends Religious Services:    Active Member of Clubs or Organizations:    Attends Engineer, structural:    Marital Status:   Intimate Partner Violence:    Fear of Current or Ex-Partner:    Emotionally Abused:    Physically Abused:    Sexually Abused:     FAMILY HISTORY:  Family History  Problem Relation Age of Onset   Diabetes Mother    Hypertension Mother    COPD Mother    Hypertension Father    Hyperlipidemia Father    Healthy Daughter  Healthy Son    Obesity Sister    Heart disease Brother    Liver disease Brother    Hypertension Maternal Grandmother    Diabetes Maternal Grandmother    Diverticulitis Maternal Grandmother    Heart disease Maternal Grandfather    Diabetes Paternal Grandmother    Hypertension Paternal Grandmother    Cancer Paternal Grandfather    Obesity Sister    Healthy Daughter     CURRENT MEDICATIONS:  Current Outpatient Medications  Medication Sig Dispense Refill   atorvastatin (LIPITOR) 10 MG tablet Take 10 mg by mouth daily.     buPROPion (WELLBUTRIN XL) 300 MG 24 hr tablet Take 300 mg by mouth daily.     cyanocobalamin 1000 MCG tablet Take 1,000 mcg by mouth daily.      ferrous sulfate 325 (65 FE) MG tablet Take 325 mg by mouth 3 (three) times daily with  meals.     lisinopril (ZESTRIL) 20 MG tablet Take 20 mg by mouth daily.     metFORMIN (GLUCOPHAGE) 500 MG tablet Take 1 tablet (500 mg total) by mouth 2 (two) times daily with a meal. 60 tablet 2   Multiple Vitamin (MULTIVITAMIN) capsule Take 1 capsule by mouth daily.     Vitamin D, Ergocalciferol, (DRISDOL) 50000 units CAPS capsule Take 50,000 Units by mouth 2 (two) times a week.     acetaminophen (TYLENOL) 325 MG tablet Take 650 mg by mouth every 6 (six) hours as needed for moderate pain. (Patient not taking: Reported on 01/25/2020)     No current facility-administered medications for this visit.    ALLERGIES:  No Known Allergies  PHYSICAL EXAM:  Performance status (ECOG): 0 - Asymptomatic  Vitals:   01/25/20 1545  BP: 119/87  Pulse: 94  Resp: 18  Temp: (!) 97.5 F (36.4 C)  SpO2: 97%   Wt Readings from Last 3 Encounters:  01/25/20 184 lb 14.4 oz (83.9 kg)  10/25/19 195 lb (88.5 kg)  05/31/19 221 lb (100.2 kg)   Physical Exam Vitals reviewed.  Constitutional:      Appearance: Normal appearance. She is obese.  Cardiovascular:     Rate and Rhythm: Normal rate and regular rhythm.     Pulses: Normal pulses.     Heart sounds: Normal heart sounds.  Pulmonary:     Effort: Pulmonary effort is normal.     Breath sounds: Normal breath sounds.  Abdominal:     Palpations: Abdomen is soft. There is no hepatomegaly, splenomegaly or mass.     Tenderness: There is no abdominal tenderness.     Hernia: No hernia is present.     Comments: Midline hypogastric incision  Neurological:     General: No focal deficit present.     Mental Status: She is alert and oriented to person, place, and time.  Psychiatric:        Mood and Affect: Mood normal.        Behavior: Behavior normal.     LABORATORY DATA:  I have reviewed the labs as listed.  CBC Latest Ref Rng & Units 01/18/2020 10/19/2019 08/05/2019  WBC 4.0 - 10.5 K/uL 4.4 4.0 2.9(L)  Hemoglobin 12.0 - 15.0 g/dL 11.2(L) 11.0(L) 12.3   Hematocrit 36 - 46 % 35.4(L) 34.5(L) 39.0  Platelets 150 - 400 K/uL 307 339 255   CMP Latest Ref Rng & Units 01/18/2020 10/19/2019 05/31/2019  Glucose 70 - 99 mg/dL 99 416(L) 845(X)  BUN 6 - 20 mg/dL 13 11 10   Creatinine 0.44 -  1.00 mg/dL 9.60 4.54 0.98  Sodium 135 - 145 mmol/L 137 141 135  Potassium 3.5 - 5.1 mmol/L 3.9 3.7 4.1  Chloride 98 - 111 mmol/L 105 107 103  CO2 22 - 32 mmol/L 24 25 24   Calcium 8.9 - 10.3 mg/dL ) 1.1(B) 1.4(N)  Total Protein 6.5 - 8.1 g/dL 6.4(L) 5.9(L) 7.1  Total Bilirubin 0.3 - 1.2 mg/dL 0.3 0.5 8.2(N)  Alkaline Phos 38 - 126 U/L 46 36(L) 51  AST 15 - 41 U/L 23 13(L) 19  ALT 0 - 44 U/L 23 16 13       Component Value Date/Time   RBC 3.75 (L) 01/18/2020 1355   MCV 94.4 01/18/2020 1355   MCH 29.9 01/18/2020 1355   MCHC 31.6 01/18/2020 1355   RDW 13.6 01/18/2020 1355   LYMPHSABS 1.4 01/18/2020 1355   MONOABS 0.3 01/18/2020 1355   EOSABS 0.2 01/18/2020 1355   BASOSABS 0.0 01/18/2020 1355   Lab Results  Component Value Date   TIBC 342 01/18/2020   TIBC 281 10/25/2019   TIBC 434 05/31/2019   FERRITIN 12 01/18/2020   FERRITIN 21 10/25/2019   FERRITIN 3 (L) 05/31/2019   IRONPCTSAT 15 01/18/2020   IRONPCTSAT 12 10/25/2019   IRONPCTSAT 5 (L) 05/31/2019   Lab Results  Component Value Date   LDH 111 08/05/2019    DIAGNOSTIC IMAGING:  I have independently reviewed the scans and discussed with the patient. No results found.   ASSESSMENT:  1.  Normocytic anemia from iron deficiency: -Gastric bypass surgery with malabsorption. -Last Feraheme on 06/14/2019.  2.  B12 deficiency: -She is currently taking B12 1 mg tablet daily.   PLAN:  1.  Normocytic anemia from iron deficiency: -Reviewed labs from 01/18/2020.  Ferritin is 12, down from 21.  Hemoglobin 11.2. -I have recommended Feraheme weekly x2. -RTC 4 months with repeat labs.  2.  B12 deficiency: -Currently taking p.o. B12.  Vitamin B12 level is 88. -I have given injection B12 today.  We  will prescribe B12 injection weekly x4 followed by every 4 weeks.  We will check B12 at next visit.  Orders placed this encounter:  No orders of the defined types were placed in this encounter.    14/07/2018, MD [facility] [phone]   I, [facility], am acting as a scribe for Dr. [phone].  I, Drue Second MD, have reviewed the above documentation for accuracy and completeness, and I agree with the above.

## 2020-02-03 ENCOUNTER — Other Ambulatory Visit: Payer: Self-pay

## 2020-02-03 ENCOUNTER — Inpatient Hospital Stay (HOSPITAL_COMMUNITY): Payer: Medicaid Other

## 2020-02-03 ENCOUNTER — Encounter (HOSPITAL_COMMUNITY): Payer: Self-pay

## 2020-02-03 VITALS — BP 131/89 | HR 71 | Temp 98.4°F | Resp 18

## 2020-02-03 DIAGNOSIS — D500 Iron deficiency anemia secondary to blood loss (chronic): Secondary | ICD-10-CM

## 2020-02-03 DIAGNOSIS — D508 Other iron deficiency anemias: Secondary | ICD-10-CM | POA: Diagnosis not present

## 2020-02-03 DIAGNOSIS — D649 Anemia, unspecified: Secondary | ICD-10-CM

## 2020-02-03 MED ORDER — SODIUM CHLORIDE 0.9 % IV SOLN: Freq: Once | INTRAVENOUS | Status: AC

## 2020-02-03 MED ORDER — ACETAMINOPHEN 325 MG PO TABS
650.0000 mg | ORAL_TABLET | Freq: Once | ORAL | Status: AC
  Administered 2020-02-03: 650 mg via ORAL
  Filled 2020-02-03: qty 2

## 2020-02-03 MED ORDER — LORATADINE 10 MG PO TABS
10.0000 mg | ORAL_TABLET | Freq: Once | ORAL | Status: AC
  Administered 2020-02-03: 10 mg via ORAL
  Filled 2020-02-03: qty 1

## 2020-02-03 MED ORDER — SODIUM CHLORIDE 0.9 % IV SOLN
510.0000 mg | Freq: Once | INTRAVENOUS | Status: AC
  Administered 2020-02-03: 510 mg via INTRAVENOUS
  Filled 2020-02-03: qty 510

## 2020-02-03 NOTE — Patient Instructions (Signed)
[facility] at [facility]  Discharge Instructions:   _______________________________________________________________  Thank you for choosing [facility] at [facility] to provide your oncology and hematology care.  To afford each patient quality time with our providers, please arrive at least 15 minutes before your scheduled appointment.  You need to re-schedule your appointment if you arrive 10 or more minutes late.  We strive to give you quality time with our providers, and arriving late affects you and other patients whose appointments are after yours.  Also, if you no show three or more times for appointments you may be dismissed from the clinic.  Again, thank you for choosing [facility] at [facility]. Our hope is that these requests will allow you access to exceptional care and in a timely manner. _______________________________________________________________  If you have questions after your visit, please contact our office at [phone] between the hours of 8:30 a.m. and 5:00 p.m. Voicemails left after 4:30 p.m. will not be returned until the following business day. _______________________________________________________________  For prescription refill requests, have your pharmacy contact our office. _______________________________________________________________  Recommendations made by the consultant and any test results will be sent to your referring physician. _______________________________________________________________ 

## 2020-02-03 NOTE — Progress Notes (Signed)
Feraheme given today per MD orders. Tolerated infusion without adverse affects. Vital signs stable. No complaints at this time. Discharged from clinic ambulatory. F/U with [facility] as scheduled.  

## 2020-02-10 ENCOUNTER — Other Ambulatory Visit: Payer: Self-pay

## 2020-02-10 ENCOUNTER — Encounter (HOSPITAL_COMMUNITY): Payer: Self-pay

## 2020-02-10 ENCOUNTER — Inpatient Hospital Stay (HOSPITAL_COMMUNITY): Payer: Medicaid Other

## 2020-02-10 VITALS — BP 103/66 | HR 88 | Temp 97.5°F | Resp 18

## 2020-02-10 DIAGNOSIS — D649 Anemia, unspecified: Secondary | ICD-10-CM

## 2020-02-10 DIAGNOSIS — D500 Iron deficiency anemia secondary to blood loss (chronic): Secondary | ICD-10-CM

## 2020-02-10 DIAGNOSIS — D508 Other iron deficiency anemias: Secondary | ICD-10-CM | POA: Diagnosis not present

## 2020-02-10 MED ORDER — SODIUM CHLORIDE 0.9 % IV SOLN
510.0000 mg | Freq: Once | INTRAVENOUS | Status: AC
  Administered 2020-02-10: 510 mg via INTRAVENOUS
  Filled 2020-02-10: qty 17

## 2020-02-10 MED ORDER — ACETAMINOPHEN 325 MG PO TABS
ORAL_TABLET | ORAL | Status: AC
  Filled 2020-02-10: qty 2

## 2020-02-10 MED ORDER — LORATADINE 10 MG PO TABS
10.0000 mg | ORAL_TABLET | Freq: Once | ORAL | Status: AC
  Administered 2020-02-10: 10 mg via ORAL

## 2020-02-10 MED ORDER — LORATADINE 10 MG PO TABS
ORAL_TABLET | ORAL | Status: AC
  Filled 2020-02-10: qty 1

## 2020-02-10 MED ORDER — ACETAMINOPHEN 325 MG PO TABS
650.0000 mg | ORAL_TABLET | Freq: Once | ORAL | Status: AC
  Administered 2020-02-10: 650 mg via ORAL

## 2020-02-10 MED ORDER — SODIUM CHLORIDE 0.9 % IV SOLN: Freq: Once | INTRAVENOUS | Status: AC

## 2020-02-10 NOTE — Patient Instructions (Signed)
[facility] at [facility]  Discharge Instructions:   _______________________________________________________________  Thank you for choosing [facility] at [facility] to provide your oncology and hematology care.  To afford each patient quality time with our providers, please arrive at least 15 minutes before your scheduled appointment.  You need to re-schedule your appointment if you arrive 10 or more minutes late.  We strive to give you quality time with our providers, and arriving late affects you and other patients whose appointments are after yours.  Also, if you no show three or more times for appointments you may be dismissed from the clinic.  Again, thank you for choosing [facility] at [facility]. Our hope is that these requests will allow you access to exceptional care and in a timely manner. _______________________________________________________________  If you have questions after your visit, please contact our office at [phone] between the hours of 8:30 a.m. and 5:00 p.m. Voicemails left after 4:30 p.m. will not be returned until the following business day. _______________________________________________________________  For prescription refill requests, have your pharmacy contact our office. _______________________________________________________________  Recommendations made by the consultant and any test results will be sent to your referring physician. _______________________________________________________________ 

## 2020-02-10 NOTE — Progress Notes (Signed)
Jaclyn Vaughn tolerated iron infusion well today without incidence.  Discharged ambulatory. Vital signs WNL.

## 2020-05-07 ENCOUNTER — Other Ambulatory Visit (HOSPITAL_COMMUNITY): Payer: Self-pay | Admitting: Family Medicine

## 2020-05-07 DIAGNOSIS — Z1231 Encounter for screening mammogram for malignant neoplasm of breast: Secondary | ICD-10-CM

## 2020-05-23 ENCOUNTER — Other Ambulatory Visit: Payer: Self-pay

## 2020-05-23 ENCOUNTER — Inpatient Hospital Stay (HOSPITAL_COMMUNITY): Payer: Medicaid Other | Attending: Hematology

## 2020-05-23 ENCOUNTER — Ambulatory Visit (HOSPITAL_COMMUNITY)
Admission: RE | Admit: 2020-05-23 | Discharge: 2020-05-23 | Disposition: A | Discharge status: Home / Self Care | Payer: Medicaid Other | Source: Ambulatory Visit | Attending: Family Medicine | Admitting: Family Medicine

## 2020-05-23 DIAGNOSIS — E538 Deficiency of other specified B group vitamins: Secondary | ICD-10-CM | POA: Insufficient documentation

## 2020-05-23 DIAGNOSIS — Z8379 Family history of other diseases of the digestive system: Secondary | ICD-10-CM | POA: Insufficient documentation

## 2020-05-23 DIAGNOSIS — Z833 Family history of diabetes mellitus: Secondary | ICD-10-CM | POA: Diagnosis not present

## 2020-05-23 DIAGNOSIS — D509 Iron deficiency anemia, unspecified: Secondary | ICD-10-CM | POA: Insufficient documentation

## 2020-05-23 DIAGNOSIS — Z9049 Acquired absence of other specified parts of digestive tract: Secondary | ICD-10-CM | POA: Insufficient documentation

## 2020-05-23 DIAGNOSIS — D500 Iron deficiency anemia secondary to blood loss (chronic): Secondary | ICD-10-CM

## 2020-05-23 DIAGNOSIS — Z809 Family history of malignant neoplasm, unspecified: Secondary | ICD-10-CM | POA: Insufficient documentation

## 2020-05-23 DIAGNOSIS — K909 Intestinal malabsorption, unspecified: Secondary | ICD-10-CM | POA: Diagnosis not present

## 2020-05-23 DIAGNOSIS — Z9884 Bariatric surgery status: Secondary | ICD-10-CM | POA: Diagnosis not present

## 2020-05-23 DIAGNOSIS — Z836 Family history of other diseases of the respiratory system: Secondary | ICD-10-CM | POA: Diagnosis not present

## 2020-05-23 DIAGNOSIS — Z1231 Encounter for screening mammogram for malignant neoplasm of breast: Secondary | ICD-10-CM | POA: Insufficient documentation

## 2020-05-23 DIAGNOSIS — Z8249 Family history of ischemic heart disease and other diseases of the circulatory system: Secondary | ICD-10-CM | POA: Insufficient documentation

## 2020-05-23 DIAGNOSIS — Z8349 Family history of other endocrine, nutritional and metabolic diseases: Secondary | ICD-10-CM | POA: Insufficient documentation

## 2020-05-23 DIAGNOSIS — E669 Obesity, unspecified: Secondary | ICD-10-CM | POA: Diagnosis not present

## 2020-05-23 DIAGNOSIS — Z79899 Other long term (current) drug therapy: Secondary | ICD-10-CM | POA: Insufficient documentation

## 2020-05-23 LAB — FOLATE: Folate: 7.3 ng/mL (ref >5.9)

## 2020-05-23 LAB — CBC WITH DIFFERENTIAL/PLATELET
Abs Immature Granulocytes: 0 10*3/uL (ref 0.00–0.07)
Basophils Absolute: 0 10*3/uL (ref 0.0–0.1)
Basophils Relative: 1 %
Eosinophils Absolute: 0.2 10*3/uL (ref 0.0–0.5)
Eosinophils Relative: 5 %
HCT: 40.6 % (ref 36.0–46.0)
Hemoglobin: 13 g/dL (ref 12.0–15.0)
Immature Granulocytes: 0 %
Lymphocytes Relative: 34 %
Lymphs Abs: 1.2 10*3/uL (ref 0.7–4.0)
MCH: 31 pg (ref 26.0–34.0)
MCHC: 32 g/dL (ref 30.0–36.0)
MCV: 96.9 fL (ref 80.0–100.0)
Monocytes Absolute: 0.3 10*3/uL (ref 0.1–1.0)
Monocytes Relative: 10 %
Neutro Abs: 1.8 10*3/uL (ref 1.7–7.7)
Neutrophils Relative %: 50 %
Platelets: 260 10*3/uL (ref 150–400)
RBC: 4.19 MIL/uL (ref 3.87–5.11)
RDW: 12.5 % (ref 11.5–15.5)
WBC: 3.6 10*3/uL — ABNORMAL LOW (ref 4.0–10.5)
nRBC: 1.7 % — ABNORMAL HIGH (ref 0.0–0.2)

## 2020-05-23 LAB — IRON AND TIBC
Iron: 114 ug/dL (ref 28–170)
Saturation Ratios: 37 % — ABNORMAL HIGH (ref 10.4–31.8)
TIBC: 305 ug/dL (ref 250–450)
UIBC: 191 ug/dL

## 2020-05-23 LAB — VITAMIN B12: Vitamin B-12: 149 pg/mL — ABNORMAL LOW (ref 180–914)

## 2020-05-23 LAB — FERRITIN: Ferritin: 129 ng/mL (ref 11–307)

## 2020-05-28 ENCOUNTER — Inpatient Hospital Stay (HOSPITAL_BASED_OUTPATIENT_CLINIC_OR_DEPARTMENT_OTHER): Payer: Medicaid Other | Admitting: Oncology

## 2020-05-28 ENCOUNTER — Other Ambulatory Visit: Payer: Self-pay

## 2020-05-28 VITALS — BP 130/89 | HR 84 | Temp 96.9°F | Resp 18 | Wt 182.1 lb

## 2020-05-28 DIAGNOSIS — D509 Iron deficiency anemia, unspecified: Secondary | ICD-10-CM | POA: Diagnosis not present

## 2020-05-28 DIAGNOSIS — D500 Iron deficiency anemia secondary to blood loss (chronic): Secondary | ICD-10-CM | POA: Diagnosis not present

## 2020-05-28 MED ORDER — CYANOCOBALAMIN 1000 MCG/ML IJ SOLN: 1000.0000 ug | INTRAMUSCULAR | 1 refills | Status: AC

## 2020-05-28 NOTE — Progress Notes (Signed)
[facility] [address] [place], [place] [address]   CLINIC:  Medical Oncology/Hematology  PCP:  Alisia FerrariGill, Pushpinder K, NP [address] [place]/ [place] [id number]  [phone]  REASON FOR VISIT:  Follow-up for iron deficiency anemia and B12 deficiency  PRIOR THERAPY: None  CURRENT THERAPY: Intermittent Feraheme and B12 1 mg tablet daily  INTERVAL HISTORY:  [name], a 39 y.o. female, returns for routine follow-up for her iron deficiency anemia and B12 deficiency. Jaclyn Vaughn was last seen on 01/25/20.  She last received IV iron on 02/03/2020 and 02/10/2020.  She was given B12 injections weekly x4 and now is on monthly B12 injections.  She gets these injections to herself at home.  She is asking for a refill of her B12. She denies any numbness or tingling in her fingers and toes.   REVIEW OF SYSTEMS:  Review of Systems  Constitutional: Negative for appetite change and fatigue.  Neurological: Negative for numbness.  Hematological: Negative for adenopathy.  All other systems reviewed and are negative.   PAST MEDICAL/SURGICAL HISTORY:  Past Medical History:  Diagnosis Date  . Anemia   . Anxiety   . Depression   . Diabetes mellitus without complication (HCC)   . Heart murmur   . History of vitamin D deficiency   . Hyperlipidemia   . Hypertension    Past Surgical History:  Procedure Laterality Date  . APPENDECTOMY    . CESAREAN SECTION  02/2005  . GASTRIC BYPASS    . HERNIA REPAIR    . lap wound exploration    . TUBAL LIGATION      SOCIAL HISTORY:  Social History   Socioeconomic History  . Marital status: Single    Spouse name: Not on file  . Number of children: 3  . Years of education: Not on file  . Highest education level: Not on file  Occupational History  . Occupation: Employed  Tobacco Use  . Smoking status: Never Smoker  . Smokeless tobacco: Never Used  Vaping Use  . Vaping Use: Never used  Substance and Sexual Activity  .  Alcohol use: No  . Drug use: No  . Sexual activity: Yes    Birth control/protection: Condom  Other Topics Concern  . Not on file  Social History Narrative  . Not on file   Social Determinants of Health   Financial Resource Strain: Low Risk   . Difficulty of Paying Living Expenses: Not hard at all  Food Insecurity: No Food Insecurity  . Worried About Programme researcher, broadcasting/film/videounning Out of Food in the Last Year: Never true  . Ran Out of Food in the Last Year: Never true  Transportation Needs: No Transportation Needs  . Lack of Transportation (Medical): No  . Lack of Transportation (Non-Medical): No  Physical Activity: Inactive  . Days of Exercise per Week: 0 days  . Minutes of Exercise per Session: 0 min  Stress: No Stress Concern Present  . Feeling of Stress : Not at all  Social Connections: Moderately Isolated  . Frequency of Communication with Friends and Family: More than three times a week  . Frequency of Social Gatherings with Friends and Family: More than three times a week  . Attends Religious Services: Never  . Active Member of Clubs or Organizations: No  . Attends BankerClub or Organization Meetings: Never  . Marital Status: Married  Catering managerntimate Partner Violence: Not At Risk  . Fear of Current or Ex-Partner: No  . Emotionally Abused:  No  . Physically Abused: No  . Sexually Abused: No    FAMILY HISTORY:  Family History  Problem Relation Age of Onset  . Diabetes Mother   . Hypertension Mother   . COPD Mother   . Hypertension Father   . Hyperlipidemia Father   . Healthy Daughter   . Healthy Son   . Obesity Sister   . Heart disease Brother   . Liver disease Brother   . Hypertension Maternal Grandmother   . Diabetes Maternal Grandmother   . Diverticulitis Maternal Grandmother   . Heart disease Maternal Grandfather   . Diabetes Paternal Grandmother   . Hypertension Paternal Grandmother   . Cancer Paternal Grandfather   . Obesity Sister   . Healthy Daughter     CURRENT MEDICATIONS:   Current Outpatient Medications  Medication Sig Dispense Refill  . acetaminophen (TYLENOL) 325 MG tablet Take 650 mg by mouth every 6 (six) hours as needed for moderate pain.     Marland Kitchen atorvastatin (LIPITOR) 10 MG tablet Take 10 mg by mouth daily.    Marland Kitchen buPROPion (WELLBUTRIN XL) 300 MG 24 hr tablet Take 300 mg by mouth daily.    . cyanocobalamin (,VITAMIN B-12,) 1000 MCG/ML injection Inject 1,000 mcg into the muscle every 30 (thirty) days.    . cyanocobalamin 1000 MCG tablet Take 1,000 mcg by mouth daily.     . ferrous sulfate 325 (65 FE) MG tablet Take 325 mg by mouth 3 (three) times daily with meals.    Marland Kitchen lisinopril (ZESTRIL) 20 MG tablet Take 20 mg by mouth daily.    . metFORMIN (GLUCOPHAGE) 500 MG tablet Take 1 tablet (500 mg total) by mouth 2 (two) times daily with a meal. 60 tablet 2  . Multiple Vitamin (MULTIVITAMIN) capsule Take 1 capsule by mouth daily.    . Syringe/Needle, Disp, (SYRINGE 3CC/23GX1") 23G X 1" 3 ML MISC 1 Units by Does not apply route once a week. 10 each 6  . Vitamin D, Ergocalciferol, (DRISDOL) 50000 units CAPS capsule Take 50,000 Units by mouth 2 (two) times a week.     No current facility-administered medications for this visit.    ALLERGIES:  No Known Allergies  PHYSICAL EXAM:  Performance status (ECOG): 0 - Asymptomatic  Vitals:   05/28/20 1044  BP: 130/89  Pulse: 84  Resp: 18  Temp: (!) 96.9 F (36.1 C)  SpO2: 100%   Wt Readings from Last 3 Encounters:  05/28/20 182 lb 1.6 oz (82.6 kg)  01/25/20 184 lb 14.4 oz (83.9 kg)  10/25/19 195 lb (88.5 kg)   Physical Exam Vitals reviewed.  Constitutional:      Appearance: Normal appearance. She is obese.  Cardiovascular:     Rate and Rhythm: Normal rate and regular rhythm.     Pulses: Normal pulses.     Heart sounds: Normal heart sounds.  Pulmonary:     Effort: Pulmonary effort is normal.     Breath sounds: Normal breath sounds.  Abdominal:     Palpations: Abdomen is soft. There is no hepatomegaly,  splenomegaly or mass.     Tenderness: There is no abdominal tenderness.     Hernia: No hernia is present.     Comments: Midline hypogastric incision  Neurological:     General: No focal deficit present.     Mental Status: She is alert and oriented to person, place, and time.  Psychiatric:        Mood and Affect: Mood normal.  Behavior: Behavior normal.     LABORATORY DATA:  I have reviewed the labs as listed.  CBC Latest Ref Rng & Units 05/23/2020 01/18/2020 10/19/2019  WBC 4.0 - 10.5 K/uL 3.6(L) 4.4 4.0  Hemoglobin 12.0 - 15.0 g/dL 16.9 11.2(L) 11.0(L)  Hematocrit 36 - 46 % 40.6 35.4(L) 34.5(L)  Platelets 150 - 400 K/uL 260 307 339   CMP Latest Ref Rng & Units 01/18/2020 10/19/2019 05/31/2019  Glucose 70 - 99 mg/dL 99 450(T) 888(K)  BUN 6 - 20 mg/dL 13 11 10   Creatinine 0.44 - 1.00 mg/dL 8.00 3.49  Sodium 135 - 145 mmol/L 137 141 135  Potassium 3.5 - 5.1 mmol/L 3.9 3.7 4.1  Chloride 98 - 111 mmol/L 105 107 103  CO2 22 - 32 mmol/L 24 25 24   Calcium 8.9 - 10.3 mg/dL 1.79) ) 1.5(A)  Total Protein 6.5 - 8.1 g/dL 6.4(L) 5.9(L) 7.1  Total Bilirubin 0.3 - 1.2 mg/dL 0.3 0.5 5.6(P)  Alkaline Phos 38 - 126 U/L 46 36(L) 51  AST 15 - 41 U/L 23 13(L) 19  ALT 0 - 44 U/L 23 16 13       Component Value Date/Time   RBC 4.19 05/23/2020 1121   MCV 96.9 05/23/2020 1121   MCH 31.0 05/23/2020 1121   MCHC 32.0 05/23/2020 1121   RDW 12.5 05/23/2020 1121   LYMPHSABS 1.2 05/23/2020 1121   MONOABS 0.3 05/23/2020 1121   EOSABS 0.2 05/23/2020 1121   BASOSABS 0.0 05/23/2020 1121   Lab Results  Component Value Date   TIBC 305 05/23/2020   TIBC 342 01/18/2020   TIBC 281 10/25/2019   FERRITIN 129 05/23/2020   FERRITIN 12 01/18/2020   FERRITIN 21 10/25/2019   IRONPCTSAT 37 (H) 05/23/2020   IRONPCTSAT 15 01/18/2020   IRONPCTSAT 12 10/25/2019   Lab Results  Component Value Date   LDH 111 08/05/2019    DIAGNOSTIC IMAGING:  I have independently reviewed the scans and discussed  with the patient. MM 3D SCREEN BREAST BILATERAL  Result Date: 05/25/2020 CLINICAL DATA:  Screening. EXAM: DIGITAL SCREENING BILATERAL MAMMOGRAM WITH TOMO AND CAD COMPARISON:  None. ACR Breast Density Category b: There are scattered areas of fibroglandular density. FINDINGS: There are no findings suspicious for malignancy. Images were processed with CAD. IMPRESSION: No mammographic evidence of malignancy. A result letter of this screening mammogram will be mailed directly to the patient. RECOMMENDATION: Screening mammogram in one year. (Code:SM-B-01Y) BI-RADS CATEGORY  1: Negative. Electronically Signed   By: 10/27/2019 M.D.   On: 05/25/2020 12:52     ASSESSMENT:  1.  Normocytic anemia from iron deficiency: -Gastric bypass surgery with malabsorption. -Last Feraheme on 02/03/2020 and 02/10/2020  2.  B12 deficiency: -She is currently taking B12 1 mg tablet daily and monthly B12 injections.  She gives them to herself at home.  PLAN:  1.  Normocytic anemia from iron deficiency: -Reviewed labs from 05/23/2020.  Ferritin is 129.  Hemoglobin 13.0. -RTC 6 weeks with repeat labs only . -RTC in 3 months with repeat labs and assessment.   2.  B12 deficiency: -On p.o. B12 and B12 injections.  Vitamin B12 level has improved and is 129. -Continue monthly B12 injections.  -New prescription sent to pharmacy. -We will check B12 at next visit.  Disposition: -RTC in 6 weeks with lab work only (vitamin B12, CBC, ferritin and iron panel).   Greater than 50% was spent in counseling and coordination of care with this patient including but not limited  to discussion of the relevant topics above (See A&P) including, but not limited to diagnosis and management of acute and chronic medical conditions.    Orders placed this encounter:  No orders of the defined types were placed in this encounter.  Durenda Hurt, NP 05/28/2020 11:15 AM  [facility] [phone]

## 2020-05-30 ENCOUNTER — Ambulatory Visit (HOSPITAL_COMMUNITY): Payer: Medicaid Other | Admitting: Nurse Practitioner

## 2020-07-09 ENCOUNTER — Other Ambulatory Visit (HOSPITAL_COMMUNITY): Payer: Self-pay | Admitting: Surgery

## 2020-07-09 DIAGNOSIS — D500 Iron deficiency anemia secondary to blood loss (chronic): Secondary | ICD-10-CM

## 2020-07-10 ENCOUNTER — Inpatient Hospital Stay (HOSPITAL_COMMUNITY): Payer: Medicaid Other | Attending: Hematology

## 2020-07-10 ENCOUNTER — Other Ambulatory Visit: Payer: Self-pay

## 2020-07-10 DIAGNOSIS — K909 Intestinal malabsorption, unspecified: Secondary | ICD-10-CM | POA: Insufficient documentation

## 2020-07-10 DIAGNOSIS — D508 Other iron deficiency anemias: Secondary | ICD-10-CM | POA: Diagnosis present

## 2020-07-10 DIAGNOSIS — D500 Iron deficiency anemia secondary to blood loss (chronic): Secondary | ICD-10-CM

## 2020-07-10 LAB — CBC WITH DIFFERENTIAL/PLATELET
Abs Immature Granulocytes: 0 10*3/uL (ref 0.00–0.07)
Basophils Absolute: 0 10*3/uL (ref 0.0–0.1)
Basophils Relative: 1 %
Eosinophils Absolute: 0.2 10*3/uL (ref 0.0–0.5)
Eosinophils Relative: 5 %
HCT: 39.4 % (ref 36.0–46.0)
Hemoglobin: 12.7 g/dL (ref 12.0–15.0)
Immature Granulocytes: 0 %
Lymphocytes Relative: 24 %
Lymphs Abs: 0.8 10*3/uL (ref 0.7–4.0)
MCH: 31.2 pg (ref 26.0–34.0)
MCHC: 32.2 g/dL (ref 30.0–36.0)
MCV: 96.8 fL (ref 80.0–100.0)
Monocytes Absolute: 0.3 10*3/uL (ref 0.1–1.0)
Monocytes Relative: 9 %
Neutro Abs: 2 10*3/uL (ref 1.7–7.7)
Neutrophils Relative %: 61 %
Platelets: 258 10*3/uL (ref 150–400)
RBC: 4.07 MIL/uL (ref 3.87–5.11)
RDW: 12.7 % (ref 11.5–15.5)
WBC: 3.2 10*3/uL — ABNORMAL LOW (ref 4.0–10.5)
nRBC: 0 % (ref 0.0–0.2)

## 2020-07-10 LAB — IRON AND TIBC
Iron: 113 ug/dL (ref 28–170)
Saturation Ratios: 39 % — ABNORMAL HIGH (ref 10.4–31.8)
TIBC: 293 ug/dL (ref 250–450)
UIBC: 180 ug/dL

## 2020-07-10 LAB — FERRITIN: Ferritin: 108 ng/mL (ref 11–307)

## 2020-07-10 LAB — VITAMIN B12: Vitamin B-12: 140 pg/mL — ABNORMAL LOW (ref 180–914)

## 2020-08-14 ENCOUNTER — Inpatient Hospital Stay (HOSPITAL_COMMUNITY): Payer: Medicaid Other

## 2020-08-15 ENCOUNTER — Inpatient Hospital Stay (HOSPITAL_COMMUNITY): Payer: Medicaid Other | Attending: Hematology

## 2020-08-21 ENCOUNTER — Ambulatory Visit (HOSPITAL_COMMUNITY): Payer: Medicaid Other | Admitting: Oncology

## 2021-05-06 ENCOUNTER — Other Ambulatory Visit (HOSPITAL_COMMUNITY): Payer: Self-pay | Admitting: Family Medicine

## 2021-05-06 DIAGNOSIS — Z1231 Encounter for screening mammogram for malignant neoplasm of breast: Secondary | ICD-10-CM

## 2021-05-29 ENCOUNTER — Encounter (HOSPITAL_COMMUNITY): Payer: Self-pay

## 2021-05-29 ENCOUNTER — Ambulatory Visit (HOSPITAL_COMMUNITY): Payer: Medicaid Other
